# Patient Record
Sex: Male | Born: 1945 | Race: Black or African American | Hispanic: No | Marital: Married | Smoking: Never smoker
Health system: Southern US, Community
[De-identification: ages and names within clinical notes are randomized; demographics above are authoritative.]

## PROBLEM LIST (undated history)

## (undated) DIAGNOSIS — M1990 Unspecified osteoarthritis, unspecified site: Secondary | ICD-10-CM

## (undated) DIAGNOSIS — J45998 Other asthma: Secondary | ICD-10-CM

## (undated) DIAGNOSIS — K5790 Diverticulosis of intestine, part unspecified, without perforation or abscess without bleeding: Secondary | ICD-10-CM

## (undated) DIAGNOSIS — C61 Malignant neoplasm of prostate: Secondary | ICD-10-CM

## (undated) DIAGNOSIS — K869 Disease of pancreas, unspecified: Secondary | ICD-10-CM

## (undated) DIAGNOSIS — I10 Essential (primary) hypertension: Secondary | ICD-10-CM

## (undated) HISTORY — PX: BACK SURGERY: SHX140

## (undated) HISTORY — PX: TONSILLECTOMY: SUR1361

## (undated) HISTORY — PX: BLADDER SURGERY: SHX569

---

## 1986-07-16 HISTORY — PX: LUMBAR DISC SURGERY: SHX700

## 2002-11-08 ENCOUNTER — Emergency Department (HOSPITAL_COMMUNITY): Admission: EM | Admit: 2002-11-08 | Discharge: 2002-11-08 | Payer: Self-pay | Admitting: Emergency Medicine

## 2008-12-20 ENCOUNTER — Ambulatory Visit (HOSPITAL_COMMUNITY): Admission: RE | Admit: 2008-12-20 | Discharge: 2008-12-20 | Payer: Self-pay | Admitting: Internal Medicine

## 2009-07-16 HISTORY — PX: PROSTATECTOMY: SHX69

## 2011-07-30 DIAGNOSIS — I10 Essential (primary) hypertension: Secondary | ICD-10-CM | POA: Diagnosis not present

## 2011-08-21 DIAGNOSIS — R791 Abnormal coagulation profile: Secondary | ICD-10-CM | POA: Diagnosis not present

## 2011-08-21 DIAGNOSIS — C679 Malignant neoplasm of bladder, unspecified: Secondary | ICD-10-CM | POA: Diagnosis not present

## 2011-08-21 DIAGNOSIS — N390 Urinary tract infection, site not specified: Secondary | ICD-10-CM | POA: Diagnosis not present

## 2011-08-21 DIAGNOSIS — Z8546 Personal history of malignant neoplasm of prostate: Secondary | ICD-10-CM | POA: Diagnosis not present

## 2011-08-21 DIAGNOSIS — C61 Malignant neoplasm of prostate: Secondary | ICD-10-CM | POA: Diagnosis not present

## 2011-09-14 DIAGNOSIS — I10 Essential (primary) hypertension: Secondary | ICD-10-CM | POA: Diagnosis not present

## 2011-09-14 DIAGNOSIS — C679 Malignant neoplasm of bladder, unspecified: Secondary | ICD-10-CM | POA: Diagnosis not present

## 2011-09-14 DIAGNOSIS — C672 Malignant neoplasm of lateral wall of bladder: Secondary | ICD-10-CM | POA: Diagnosis not present

## 2011-09-14 DIAGNOSIS — C676 Malignant neoplasm of ureteric orifice: Secondary | ICD-10-CM | POA: Diagnosis not present

## 2011-09-14 DIAGNOSIS — R011 Cardiac murmur, unspecified: Secondary | ICD-10-CM | POA: Diagnosis not present

## 2011-09-14 DIAGNOSIS — C61 Malignant neoplasm of prostate: Secondary | ICD-10-CM | POA: Diagnosis not present

## 2011-09-17 DIAGNOSIS — I10 Essential (primary) hypertension: Secondary | ICD-10-CM | POA: Diagnosis not present

## 2011-10-02 DIAGNOSIS — K869 Disease of pancreas, unspecified: Secondary | ICD-10-CM | POA: Diagnosis not present

## 2011-10-02 DIAGNOSIS — K862 Cyst of pancreas: Secondary | ICD-10-CM | POA: Diagnosis not present

## 2011-10-02 DIAGNOSIS — C679 Malignant neoplasm of bladder, unspecified: Secondary | ICD-10-CM | POA: Diagnosis not present

## 2011-10-02 DIAGNOSIS — C61 Malignant neoplasm of prostate: Secondary | ICD-10-CM | POA: Diagnosis not present

## 2011-10-02 DIAGNOSIS — D136 Benign neoplasm of pancreas: Secondary | ICD-10-CM | POA: Diagnosis not present

## 2011-10-16 DIAGNOSIS — I10 Essential (primary) hypertension: Secondary | ICD-10-CM | POA: Diagnosis not present

## 2011-11-22 DIAGNOSIS — I10 Essential (primary) hypertension: Secondary | ICD-10-CM | POA: Diagnosis not present

## 2011-11-22 DIAGNOSIS — J40 Bronchitis, not specified as acute or chronic: Secondary | ICD-10-CM | POA: Diagnosis not present

## 2011-12-25 DIAGNOSIS — C61 Malignant neoplasm of prostate: Secondary | ICD-10-CM | POA: Diagnosis not present

## 2012-01-21 DIAGNOSIS — I10 Essential (primary) hypertension: Secondary | ICD-10-CM | POA: Diagnosis not present

## 2012-04-01 DIAGNOSIS — K869 Disease of pancreas, unspecified: Secondary | ICD-10-CM | POA: Diagnosis not present

## 2012-04-01 DIAGNOSIS — C679 Malignant neoplasm of bladder, unspecified: Secondary | ICD-10-CM | POA: Diagnosis not present

## 2012-04-01 DIAGNOSIS — C61 Malignant neoplasm of prostate: Secondary | ICD-10-CM | POA: Diagnosis not present

## 2012-04-01 DIAGNOSIS — K862 Cyst of pancreas: Secondary | ICD-10-CM | POA: Diagnosis not present

## 2012-04-08 DIAGNOSIS — N329 Bladder disorder, unspecified: Secondary | ICD-10-CM | POA: Diagnosis not present

## 2012-04-08 DIAGNOSIS — C61 Malignant neoplasm of prostate: Secondary | ICD-10-CM | POA: Diagnosis not present

## 2012-04-22 DIAGNOSIS — I10 Essential (primary) hypertension: Secondary | ICD-10-CM | POA: Diagnosis not present

## 2012-09-29 DIAGNOSIS — I10 Essential (primary) hypertension: Secondary | ICD-10-CM | POA: Diagnosis not present

## 2012-09-29 DIAGNOSIS — L678 Other hair color and hair shaft abnormalities: Secondary | ICD-10-CM | POA: Diagnosis not present

## 2012-10-03 DIAGNOSIS — C61 Malignant neoplasm of prostate: Secondary | ICD-10-CM | POA: Diagnosis not present

## 2012-10-07 DIAGNOSIS — N329 Bladder disorder, unspecified: Secondary | ICD-10-CM | POA: Diagnosis not present

## 2012-10-16 DIAGNOSIS — I10 Essential (primary) hypertension: Secondary | ICD-10-CM | POA: Diagnosis not present

## 2012-11-18 DIAGNOSIS — I10 Essential (primary) hypertension: Secondary | ICD-10-CM | POA: Diagnosis not present

## 2012-11-18 DIAGNOSIS — L2089 Other atopic dermatitis: Secondary | ICD-10-CM | POA: Diagnosis not present

## 2012-12-01 DIAGNOSIS — I10 Essential (primary) hypertension: Secondary | ICD-10-CM | POA: Diagnosis not present

## 2012-12-01 DIAGNOSIS — D075 Carcinoma in situ of prostate: Secondary | ICD-10-CM | POA: Diagnosis not present

## 2012-12-01 DIAGNOSIS — K922 Gastrointestinal hemorrhage, unspecified: Secondary | ICD-10-CM | POA: Diagnosis not present

## 2012-12-02 DIAGNOSIS — R9389 Abnormal findings on diagnostic imaging of other specified body structures: Secondary | ICD-10-CM | POA: Diagnosis not present

## 2012-12-02 DIAGNOSIS — R978 Other abnormal tumor markers: Secondary | ICD-10-CM | POA: Diagnosis not present

## 2012-12-02 DIAGNOSIS — K625 Hemorrhage of anus and rectum: Secondary | ICD-10-CM | POA: Diagnosis not present

## 2012-12-02 DIAGNOSIS — K862 Cyst of pancreas: Secondary | ICD-10-CM | POA: Diagnosis not present

## 2012-12-05 DIAGNOSIS — K5730 Diverticulosis of large intestine without perforation or abscess without bleeding: Secondary | ICD-10-CM | POA: Diagnosis not present

## 2012-12-05 DIAGNOSIS — K625 Hemorrhage of anus and rectum: Secondary | ICD-10-CM | POA: Diagnosis not present

## 2012-12-05 DIAGNOSIS — D126 Benign neoplasm of colon, unspecified: Secondary | ICD-10-CM | POA: Diagnosis not present

## 2012-12-05 DIAGNOSIS — K921 Melena: Secondary | ICD-10-CM | POA: Diagnosis not present

## 2012-12-05 DIAGNOSIS — I10 Essential (primary) hypertension: Secondary | ICD-10-CM | POA: Diagnosis not present

## 2013-01-13 DIAGNOSIS — K862 Cyst of pancreas: Secondary | ICD-10-CM | POA: Diagnosis not present

## 2013-01-13 DIAGNOSIS — K5730 Diverticulosis of large intestine without perforation or abscess without bleeding: Secondary | ICD-10-CM | POA: Diagnosis not present

## 2013-01-20 DIAGNOSIS — K863 Pseudocyst of pancreas: Secondary | ICD-10-CM | POA: Diagnosis not present

## 2013-01-20 DIAGNOSIS — K862 Cyst of pancreas: Secondary | ICD-10-CM | POA: Diagnosis not present

## 2013-02-02 DIAGNOSIS — R970 Elevated carcinoembryonic antigen [CEA]: Secondary | ICD-10-CM | POA: Diagnosis not present

## 2013-02-02 DIAGNOSIS — K862 Cyst of pancreas: Secondary | ICD-10-CM | POA: Diagnosis not present

## 2013-02-02 DIAGNOSIS — K863 Pseudocyst of pancreas: Secondary | ICD-10-CM | POA: Diagnosis not present

## 2013-03-24 DIAGNOSIS — I10 Essential (primary) hypertension: Secondary | ICD-10-CM | POA: Diagnosis not present

## 2013-04-14 DIAGNOSIS — C61 Malignant neoplasm of prostate: Secondary | ICD-10-CM | POA: Diagnosis not present

## 2013-06-08 DIAGNOSIS — R51 Headache: Secondary | ICD-10-CM | POA: Diagnosis not present

## 2013-06-08 DIAGNOSIS — I10 Essential (primary) hypertension: Secondary | ICD-10-CM | POA: Diagnosis not present

## 2013-07-13 DIAGNOSIS — I10 Essential (primary) hypertension: Secondary | ICD-10-CM | POA: Diagnosis not present

## 2013-08-06 DIAGNOSIS — I10 Essential (primary) hypertension: Secondary | ICD-10-CM | POA: Diagnosis not present

## 2013-08-17 DIAGNOSIS — I10 Essential (primary) hypertension: Secondary | ICD-10-CM | POA: Diagnosis not present

## 2013-11-23 DIAGNOSIS — I10 Essential (primary) hypertension: Secondary | ICD-10-CM | POA: Diagnosis not present

## 2014-04-06 DIAGNOSIS — C61 Malignant neoplasm of prostate: Secondary | ICD-10-CM | POA: Diagnosis not present

## 2014-04-06 DIAGNOSIS — K862 Cyst of pancreas: Secondary | ICD-10-CM | POA: Diagnosis not present

## 2014-04-06 DIAGNOSIS — Z125 Encounter for screening for malignant neoplasm of prostate: Secondary | ICD-10-CM | POA: Diagnosis not present

## 2014-04-06 DIAGNOSIS — K863 Pseudocyst of pancreas: Secondary | ICD-10-CM | POA: Diagnosis not present

## 2014-04-13 DIAGNOSIS — Z8546 Personal history of malignant neoplasm of prostate: Secondary | ICD-10-CM | POA: Diagnosis not present

## 2014-04-13 DIAGNOSIS — Z8551 Personal history of malignant neoplasm of bladder: Secondary | ICD-10-CM | POA: Diagnosis not present

## 2014-05-31 DIAGNOSIS — E559 Vitamin D deficiency, unspecified: Secondary | ICD-10-CM | POA: Diagnosis not present

## 2014-05-31 DIAGNOSIS — D649 Anemia, unspecified: Secondary | ICD-10-CM | POA: Diagnosis not present

## 2014-05-31 DIAGNOSIS — I10 Essential (primary) hypertension: Secondary | ICD-10-CM | POA: Diagnosis not present

## 2014-05-31 DIAGNOSIS — E538 Deficiency of other specified B group vitamins: Secondary | ICD-10-CM | POA: Diagnosis not present

## 2014-07-20 DIAGNOSIS — N329 Bladder disorder, unspecified: Secondary | ICD-10-CM | POA: Diagnosis not present

## 2014-07-20 DIAGNOSIS — Z8546 Personal history of malignant neoplasm of prostate: Secondary | ICD-10-CM | POA: Diagnosis not present

## 2014-07-26 DIAGNOSIS — I10 Essential (primary) hypertension: Secondary | ICD-10-CM | POA: Diagnosis not present

## 2014-07-26 DIAGNOSIS — D649 Anemia, unspecified: Secondary | ICD-10-CM | POA: Diagnosis not present

## 2014-07-30 DIAGNOSIS — N3289 Other specified disorders of bladder: Secondary | ICD-10-CM | POA: Diagnosis not present

## 2014-07-30 DIAGNOSIS — C672 Malignant neoplasm of lateral wall of bladder: Secondary | ICD-10-CM | POA: Diagnosis not present

## 2014-07-30 DIAGNOSIS — Z801 Family history of malignant neoplasm of trachea, bronchus and lung: Secondary | ICD-10-CM | POA: Diagnosis not present

## 2014-07-30 DIAGNOSIS — D303 Benign neoplasm of bladder: Secondary | ICD-10-CM | POA: Diagnosis not present

## 2014-07-30 DIAGNOSIS — N329 Bladder disorder, unspecified: Secondary | ICD-10-CM | POA: Diagnosis not present

## 2014-07-30 DIAGNOSIS — Z8042 Family history of malignant neoplasm of prostate: Secondary | ICD-10-CM | POA: Diagnosis not present

## 2014-07-30 DIAGNOSIS — Z8551 Personal history of malignant neoplasm of bladder: Secondary | ICD-10-CM | POA: Diagnosis not present

## 2014-07-30 DIAGNOSIS — I10 Essential (primary) hypertension: Secondary | ICD-10-CM | POA: Diagnosis not present

## 2014-07-30 DIAGNOSIS — Z8546 Personal history of malignant neoplasm of prostate: Secondary | ICD-10-CM | POA: Diagnosis not present

## 2014-07-30 DIAGNOSIS — D414 Neoplasm of uncertain behavior of bladder: Secondary | ICD-10-CM | POA: Diagnosis not present

## 2014-10-26 DIAGNOSIS — Z8546 Personal history of malignant neoplasm of prostate: Secondary | ICD-10-CM | POA: Diagnosis not present

## 2014-12-02 DIAGNOSIS — I10 Essential (primary) hypertension: Secondary | ICD-10-CM | POA: Diagnosis not present

## 2014-12-02 DIAGNOSIS — D649 Anemia, unspecified: Secondary | ICD-10-CM | POA: Diagnosis not present

## 2015-01-13 DIAGNOSIS — I10 Essential (primary) hypertension: Secondary | ICD-10-CM | POA: Diagnosis not present

## 2015-01-13 DIAGNOSIS — Z01818 Encounter for other preprocedural examination: Secondary | ICD-10-CM | POA: Diagnosis not present

## 2015-01-13 DIAGNOSIS — R011 Cardiac murmur, unspecified: Secondary | ICD-10-CM | POA: Diagnosis not present

## 2015-01-13 DIAGNOSIS — K862 Cyst of pancreas: Secondary | ICD-10-CM | POA: Diagnosis not present

## 2015-01-13 DIAGNOSIS — I4510 Unspecified right bundle-branch block: Secondary | ICD-10-CM | POA: Diagnosis not present

## 2015-01-13 DIAGNOSIS — Z8551 Personal history of malignant neoplasm of bladder: Secondary | ICD-10-CM | POA: Diagnosis not present

## 2015-01-13 DIAGNOSIS — Z8546 Personal history of malignant neoplasm of prostate: Secondary | ICD-10-CM | POA: Diagnosis not present

## 2015-01-21 DIAGNOSIS — Z8551 Personal history of malignant neoplasm of bladder: Secondary | ICD-10-CM | POA: Diagnosis not present

## 2015-01-21 DIAGNOSIS — C679 Malignant neoplasm of bladder, unspecified: Secondary | ICD-10-CM | POA: Diagnosis not present

## 2015-01-21 DIAGNOSIS — D303 Benign neoplasm of bladder: Secondary | ICD-10-CM | POA: Diagnosis not present

## 2015-01-21 DIAGNOSIS — R011 Cardiac murmur, unspecified: Secondary | ICD-10-CM | POA: Diagnosis not present

## 2015-01-21 DIAGNOSIS — N329 Bladder disorder, unspecified: Secondary | ICD-10-CM | POA: Diagnosis not present

## 2015-01-21 DIAGNOSIS — Z9079 Acquired absence of other genital organ(s): Secondary | ICD-10-CM | POA: Diagnosis not present

## 2015-01-21 DIAGNOSIS — Z8546 Personal history of malignant neoplasm of prostate: Secondary | ICD-10-CM | POA: Diagnosis not present

## 2015-01-21 DIAGNOSIS — I10 Essential (primary) hypertension: Secondary | ICD-10-CM | POA: Diagnosis not present

## 2015-04-12 DIAGNOSIS — K862 Cyst of pancreas: Secondary | ICD-10-CM | POA: Diagnosis not present

## 2015-04-26 DIAGNOSIS — Z8546 Personal history of malignant neoplasm of prostate: Secondary | ICD-10-CM | POA: Diagnosis not present

## 2015-04-27 DIAGNOSIS — D649 Anemia, unspecified: Secondary | ICD-10-CM | POA: Diagnosis not present

## 2015-04-27 DIAGNOSIS — M2550 Pain in unspecified joint: Secondary | ICD-10-CM | POA: Diagnosis not present

## 2015-04-27 DIAGNOSIS — I10 Essential (primary) hypertension: Secondary | ICD-10-CM | POA: Diagnosis not present

## 2015-04-27 DIAGNOSIS — E559 Vitamin D deficiency, unspecified: Secondary | ICD-10-CM | POA: Diagnosis not present

## 2015-08-31 DIAGNOSIS — J40 Bronchitis, not specified as acute or chronic: Secondary | ICD-10-CM | POA: Diagnosis not present

## 2015-08-31 DIAGNOSIS — D649 Anemia, unspecified: Secondary | ICD-10-CM | POA: Diagnosis not present

## 2015-08-31 DIAGNOSIS — Z125 Encounter for screening for malignant neoplasm of prostate: Secondary | ICD-10-CM | POA: Diagnosis not present

## 2015-08-31 DIAGNOSIS — K529 Noninfective gastroenteritis and colitis, unspecified: Secondary | ICD-10-CM | POA: Diagnosis not present

## 2015-08-31 DIAGNOSIS — I10 Essential (primary) hypertension: Secondary | ICD-10-CM | POA: Diagnosis not present

## 2015-11-24 ENCOUNTER — Observation Stay (HOSPITAL_COMMUNITY): Payer: Medicare Other

## 2015-11-24 ENCOUNTER — Emergency Department (HOSPITAL_COMMUNITY): Payer: Medicare Other

## 2015-11-24 ENCOUNTER — Encounter (HOSPITAL_COMMUNITY): Payer: Self-pay | Admitting: Emergency Medicine

## 2015-11-24 ENCOUNTER — Inpatient Hospital Stay (HOSPITAL_COMMUNITY)
Admission: EM | Admit: 2015-11-24 | Discharge: 2015-11-26 | DRG: 378 | Disposition: A | Discharge status: Home / Self Care | Payer: Medicare Other | Attending: Family Medicine | Admitting: Family Medicine

## 2015-11-24 DIAGNOSIS — D649 Anemia, unspecified: Secondary | ICD-10-CM | POA: Insufficient documentation

## 2015-11-24 DIAGNOSIS — I10 Essential (primary) hypertension: Secondary | ICD-10-CM | POA: Diagnosis not present

## 2015-11-24 DIAGNOSIS — K922 Gastrointestinal hemorrhage, unspecified: Secondary | ICD-10-CM | POA: Diagnosis present

## 2015-11-24 DIAGNOSIS — K869 Disease of pancreas, unspecified: Secondary | ICD-10-CM | POA: Diagnosis not present

## 2015-11-24 DIAGNOSIS — K5791 Diverticulosis of intestine, part unspecified, without perforation or abscess with bleeding: Principal | ICD-10-CM | POA: Diagnosis present

## 2015-11-24 DIAGNOSIS — K219 Gastro-esophageal reflux disease without esophagitis: Secondary | ICD-10-CM | POA: Diagnosis not present

## 2015-11-24 DIAGNOSIS — R42 Dizziness and giddiness: Secondary | ICD-10-CM | POA: Diagnosis not present

## 2015-11-24 DIAGNOSIS — N179 Acute kidney failure, unspecified: Secondary | ICD-10-CM | POA: Diagnosis present

## 2015-11-24 DIAGNOSIS — K625 Hemorrhage of anus and rectum: Secondary | ICD-10-CM | POA: Diagnosis not present

## 2015-11-24 DIAGNOSIS — Z8546 Personal history of malignant neoplasm of prostate: Secondary | ICD-10-CM

## 2015-11-24 DIAGNOSIS — E46 Unspecified protein-calorie malnutrition: Secondary | ICD-10-CM | POA: Diagnosis not present

## 2015-11-24 DIAGNOSIS — I959 Hypotension, unspecified: Secondary | ICD-10-CM | POA: Diagnosis present

## 2015-11-24 DIAGNOSIS — R935 Abnormal findings on diagnostic imaging of other abdominal regions, including retroperitoneum: Secondary | ICD-10-CM | POA: Diagnosis not present

## 2015-11-24 DIAGNOSIS — Z6832 Body mass index (BMI) 32.0-32.9, adult: Secondary | ICD-10-CM

## 2015-11-24 DIAGNOSIS — D62 Acute posthemorrhagic anemia: Secondary | ICD-10-CM | POA: Diagnosis not present

## 2015-11-24 DIAGNOSIS — K921 Melena: Secondary | ICD-10-CM | POA: Diagnosis not present

## 2015-11-24 HISTORY — DX: Unspecified osteoarthritis, unspecified site: M19.90

## 2015-11-24 HISTORY — DX: Essential (primary) hypertension: I10

## 2015-11-24 HISTORY — DX: Malignant neoplasm of prostate: C61

## 2015-11-24 HISTORY — DX: Disease of pancreas, unspecified: K86.9

## 2015-11-24 HISTORY — DX: Other asthma: J45.998

## 2015-11-24 LAB — I-STAT CHEM 8, ED
BUN: 19 mg/dL (ref 6–20)
Calcium, Ion: 1.16 mmol/L (ref 1.13–1.30)
Chloride: 104 mmol/L (ref 101–111)
Creatinine, Ser: 1.3 mg/dL — ABNORMAL HIGH (ref 0.61–1.24)
Glucose, Bld: 113 mg/dL — ABNORMAL HIGH (ref 65–99)
HCT: 35 % — ABNORMAL LOW (ref 39.0–52.0)
Hemoglobin: 11.9 g/dL — ABNORMAL LOW (ref 13.0–17.0)
Potassium: 4.1 mmol/L (ref 3.5–5.1)
Sodium: 138 mmol/L (ref 135–145)
TCO2: 22 mmol/L (ref 0–100)

## 2015-11-24 LAB — CBC WITH DIFFERENTIAL/PLATELET
Basophils Absolute: 0 10*3/uL (ref 0.0–0.1)
Basophils Relative: 0 %
Eosinophils Absolute: 0 10*3/uL (ref 0.0–0.7)
Eosinophils Relative: 0 %
HCT: 30.6 % — ABNORMAL LOW (ref 39.0–52.0)
Hemoglobin: 9.7 g/dL — ABNORMAL LOW (ref 13.0–17.0)
Lymphocytes Relative: 13 %
Lymphs Abs: 0.9 10*3/uL (ref 0.7–4.0)
MCH: 19.2 pg — ABNORMAL LOW (ref 26.0–34.0)
MCHC: 31.7 g/dL (ref 30.0–36.0)
MCV: 60.7 fL — ABNORMAL LOW (ref 78.0–100.0)
Monocytes Absolute: 0.4 10*3/uL (ref 0.1–1.0)
Monocytes Relative: 6 %
Neutro Abs: 5.9 10*3/uL (ref 1.7–7.7)
Neutrophils Relative %: 81 %
Platelets: 206 10*3/uL (ref 150–400)
RBC: 5.04 MIL/uL (ref 4.22–5.81)
RDW: 16.8 % — ABNORMAL HIGH (ref 11.5–15.5)
WBC: 7.2 10*3/uL (ref 4.0–10.5)

## 2015-11-24 LAB — COMPREHENSIVE METABOLIC PANEL
ALT: 25 U/L (ref 17–63)
AST: 31 U/L (ref 15–41)
Albumin: 3.3 g/dL — ABNORMAL LOW (ref 3.5–5.0)
Alkaline Phosphatase: 37 U/L — ABNORMAL LOW (ref 38–126)
Anion gap: 13 (ref 5–15)
BUN: 15 mg/dL (ref 6–20)
CO2: 20 mmol/L — ABNORMAL LOW (ref 22–32)
Calcium: 9.5 mg/dL (ref 8.9–10.3)
Chloride: 105 mmol/L (ref 101–111)
Creatinine, Ser: 1.45 mg/dL — ABNORMAL HIGH (ref 0.61–1.24)
GFR calc Af Amer: 55 mL/min — ABNORMAL LOW (ref >60)
GFR calc non Af Amer: 47 mL/min — ABNORMAL LOW (ref >60)
Glucose, Bld: 115 mg/dL — ABNORMAL HIGH (ref 65–99)
Potassium: 4 mmol/L (ref 3.5–5.1)
Sodium: 138 mmol/L (ref 135–145)
Total Bilirubin: 0.8 mg/dL (ref 0.3–1.2)
Total Protein: 5.8 g/dL — ABNORMAL LOW (ref 6.5–8.1)

## 2015-11-24 LAB — I-STAT TROPONIN, ED: Troponin i, poc: 0.01 ng/mL (ref 0.00–0.08)

## 2015-11-24 LAB — POC OCCULT BLOOD, ED: Fecal Occult Bld: POSITIVE — AB

## 2015-11-24 LAB — AMMONIA: Ammonia: 34 umol/L (ref 9–35)

## 2015-11-24 LAB — APTT: aPTT: 25 seconds (ref 24–37)

## 2015-11-24 LAB — PROTIME-INR
INR: 1.16 (ref 0.00–1.49)
INR: 1.17 (ref 0.00–1.49)
Prothrombin Time: 15 seconds (ref 11.6–15.2)
Prothrombin Time: 15.1 seconds (ref 11.6–15.2)

## 2015-11-24 LAB — I-STAT CG4 LACTIC ACID, ED
Lactic Acid, Venous: 4.02 mmol/L (ref 0.5–2.0)
Lactic Acid, Venous: 1.24 mmol/L (ref 0.5–2.0)

## 2015-11-24 LAB — HEMOGLOBIN AND HEMATOCRIT, BLOOD
HCT: 27.1 % — ABNORMAL LOW (ref 39.0–52.0)
Hemoglobin: 8.7 g/dL — ABNORMAL LOW (ref 13.0–17.0)

## 2015-11-24 LAB — LIPASE, BLOOD: Lipase: 21 U/L (ref 11–51)

## 2015-11-24 LAB — ABO/RH: ABO/RH(D): O POS

## 2015-11-24 MED ORDER — SODIUM CHLORIDE 0.9 % IV SOLN
INTRAVENOUS | Status: DC
  Administered 2015-11-24: 17:00:00 via INTRAVENOUS

## 2015-11-24 MED ORDER — SODIUM CHLORIDE 0.9 % IV BOLUS (SEPSIS)
1000.0000 mL | Freq: Once | INTRAVENOUS | Status: AC
  Administered 2015-11-24: 1000 mL via INTRAVENOUS

## 2015-11-24 MED ORDER — ONDANSETRON HCL 4 MG/2ML IJ SOLN: 4.0000 mg | Freq: Four times a day (QID) | INTRAMUSCULAR | Status: DC | PRN

## 2015-11-24 MED ORDER — SODIUM CHLORIDE 0.9 % IV SOLN
8.0000 mg/h | INTRAVENOUS | Status: DC
  Administered 2015-11-24: 8 mg/h via INTRAVENOUS
  Filled 2015-11-24 (×2): qty 80

## 2015-11-24 MED ORDER — SODIUM CHLORIDE 0.9 % IV SOLN
80.0000 mg | Freq: Once | INTRAVENOUS | Status: AC
  Administered 2015-11-24: 80 mg via INTRAVENOUS
  Filled 2015-11-24: qty 80

## 2015-11-24 MED ORDER — IOPAMIDOL (ISOVUE-300) INJECTION 61%
INTRAVENOUS | Status: AC
  Filled 2015-11-24: qty 100

## 2015-11-24 MED ORDER — IOPAMIDOL (ISOVUE-300) INJECTION 61%
INTRAVENOUS | Status: AC
  Administered 2015-11-24: 100 mL
  Filled 2015-11-24: qty 100

## 2015-11-24 MED ORDER — ONDANSETRON HCL 4 MG PO TABS: 4.0000 mg | ORAL_TABLET | Freq: Four times a day (QID) | ORAL | Status: DC | PRN

## 2015-11-24 MED ORDER — PANTOPRAZOLE SODIUM 40 MG IV SOLR
40.0000 mg | Freq: Two times a day (BID) | INTRAVENOUS | Status: DC
  Administered 2015-11-24 – 2015-11-26 (×4): 40 mg via INTRAVENOUS
  Filled 2015-11-24 (×4): qty 40

## 2015-11-24 MED ORDER — POLYETHYLENE GLYCOL 3350 17 G PO PACK
17.0000 g | PACK | Freq: Three times a day (TID) | ORAL | Status: DC
  Administered 2015-11-24 – 2015-11-25 (×4): 17 g via ORAL
  Filled 2015-11-24 (×5): qty 1

## 2015-11-24 MED ORDER — SODIUM CHLORIDE 0.9 % IV SOLN
INTRAVENOUS | Status: DC
  Administered 2015-11-25 – 2015-11-26 (×3): via INTRAVENOUS

## 2015-11-24 MED ORDER — SODIUM CHLORIDE 0.9 % IV SOLN
INTRAVENOUS | Status: AC
  Administered 2015-11-24: 22:00:00 via INTRAVENOUS

## 2015-11-24 NOTE — Progress Notes (Signed)
Pt wife came to door asking for assistance with beeping pump.  EDCM inspected pump to find that "air in line" was message;  [facility] turned channel off (Protonix) and notified RN Elmyra Ricks).

## 2015-11-24 NOTE — ED Notes (Signed)
Pt in from home, phlebotomy at bedside, Sys BP noted to be in the 70s with pt flailing arms, diaphoretic with intermittent confusion, Charge Rn informed & working on an available room, pt pale, skin clammy & cool to touch, IV started in triage

## 2015-11-24 NOTE — ED Notes (Signed)
Report called to Vickie rn and pt transported to CT.

## 2015-11-24 NOTE — H&P (Signed)
[facility] Admission History and Physical Service Pager: [phone]  Patient name: Gregory Odonnell Medical record number: [id number] Date of birth: 03-Jun-1946 Age: 70 y.o. Gender: male  Primary Care Provider: No primary care provider on file. Consultants: GI [facility]) Code Status: Full (per discussion on admission)  Chief Complaint: rectal bleeding  Assessment and Plan: Gregory Odonnell is a 70 y.o. male presenting with hematochezia. PMH is significant for HTN, prostate cancer, pancreatic lesion, and GI bleed.   GI bleed (bright red blood per rectum): Symptomatic hypotension on admission with BP of  75/44 > 122/82, BP stable now but does have slight tachycardia to 100-105. Pt had 15 loose stools with hematochezia since 0400 so likely volume depleted. Afebrile without leukocytosis (low suspicion for infection). Likely differentials for lower GI bleeding include hemorrhoids vs diverticular hemorhage vs AVM vs neoplastic disease. Last colonoscopy was apparently 2 years ago which was reportedly benign. No hx of colon cancer, coagulapathy, or excessive NSAID use. Hgb found to be 9.7 @ 1126 and 11.9 @1218  (unsure which is more accurate). PTT and INR normal at 15 and 1.16 respectively. + FOBT.  - Admit to [place], attending Dr. Andria Frames - Vital signs per floor protocol - GI consulted, appreciate their recommendations and assistance  -clear liquid diet, Miralax  -serial hemoglobins - mIVF: Normal saline @ 150 cc/hr - Continuous cardiac monitoring - hemoglobin/hematocrit q12 - No anticoagulation or DVT prophylaxis with active bleed - Likely lower GI bleed but will place on PPI BID until fully evaluated.  Hx of Hypertension: Symptomatic hypotension on admission. BP normal now at 131/72 after IVF. Home BP meds include Amlodipine 10 mg daily and Valsartan-HCTZ 320-12.5mg  daily.  - Hold home antihypertensives for now - If patient becomes hypertensive, may add home medications  back  AKI: Cr on admission 1.45. No other values in Epic for comparison. Likely due to volume depletion for hematochezia and diarrhea. - AM BMP - mIVF as above  FEN/GI: Normal saline @ 150 cc/hr (maintenance)/ Clears Prophylaxis: SCDs  Disposition: Admit to telemetry, attending Dr. Andria Frames  History of Present Illness:  Gregory Odonnell is a 70 y.o. male presenting with bright red blood per rectum  Patient notes that around 4am had BRBPR after a loose stool.  He notes that he had 15 loose stools before arriving to ED.  He notes that he became faint once he arrived to ED.  Has had 1 episode of hematochezia about 2 years ago.  He had a colonoscopy at [facility] during which he had a biopsy that was benign.  Denies fevers, chills, nausea, vomiting, abdominal pain, constipation, cough, congestion, myalgia, easy bruising/bleeding, SOB, palpitations.  No family h/o coagulopathies.  Occ left shoulder pain.  Patient notes that he has been on a healthy eating plan but no new foods.  No one else in family with diarrhea.  No new medications.   Patient takes a ASA 81mg .  Has not eaten today.  Review Of Systems: Per HPI with the following additions: none Otherwise the remainder of the systems were negative.  Patient Active Problem List   Diagnosis Date Noted  . GI bleed 11/24/2015  . HTN (hypertension) 11/24/2015  . History of prostate cancer 11/24/2015  . Pancreatic lesion 11/24/2015    Past Medical History: Past Medical History  Diagnosis Date  . Hypertension   . 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 Pancreatic lesion     routinely followed by [facility]  Past Surgical History: Past Surgical History  Procedure Laterality Date  . Back surgery  1988  . Prostate surgery  2011    Social History: Social History  Substance Use Topics  . Smoking status: Never Smoker   . Smokeless tobacco: None  . Alcohol Use: No   Additional social history: occ ETOH (q5 months), no  tobacco, no drugs, resides with wife  Please also refer to relevant sections of EMR.  Family History: Family History  Problem Relation Age of Onset  . Cancer Father     prostate  . Cancer Brother     lung   Allergies and Medications: No Known Allergies No current facility-administered medications on file prior to encounter.   No current outpatient prescriptions on file prior to encounter.   Objective: BP 134/79 mmHg  Pulse 94  Temp(Src) 98.1 F (36.7 C)  Resp 10  Ht 6\' 1"  (1.854 m)  Wt 245 lb (111.131 kg)  BMI 32.33 kg/m2  SpO2 97% Exam: General: In NAD, laying in bed, well-developed, pleasant, wife at bedside Eyes: PERRLA, non icteric sclera ENTM: slightly dry mucous membranes  Neck: supple, no lymphadenopathy Cardiovascular: tachycardic, regular rhythm, no murmurs, no edema. 2+ dorsalis pedis pulses bilaterally Respiratory: no increased work of breathing, CTAB Abdomen: soft, non-tender, normal bowel sounds MSK: full ROM of all joints Skin: no rashes Neuro: CN 2-12 intact, 5/5 strength in bilateral upper and lower extremities, sensation intact throughout Psych: normal mood and affect  Labs and Imaging: CBC BMET   Recent Labs Lab 11/24/15 1156 11/24/15 1218  WBC 7.2  --   HGB 9.7* 11.9*  HCT 30.6* 35.0*  PLT 206  --     Recent Labs Lab 11/24/15 1156 11/24/15 1218  NA 138 138  K 4.0 4.1  CL 105 104  CO2 20*  --   BUN 15 19  CREATININE 1.45* 1.30*  GLUCOSE 115* 113*  CALCIUM 9.5  --      Dg Chest Portable 1 View  11/24/2015  CLINICAL DATA:  Dizziness. EXAM: PORTABLE CHEST 1 VIEW COMPARISON:  December 20, 2008. FINDINGS: Stable cardiomediastinal silhouette. No pneumothorax or pleural effusion is noted. Both lungs are clear. The visualized skeletal structures are unremarkable. IMPRESSION: No acute cardiopulmonary abnormality seen. Electronically Signed   By: Marijo Conception, M.D.   On: 11/24/2015 13:33   Dg Abd Portable 1v  11/24/2015  CLINICAL DATA:   Patient with history of GI bleed. Bloody diarrhea. History of prostate cancer. EXAM: PORTABLE ABDOMEN - 1 VIEW COMPARISON:  None. FINDINGS: Gas is demonstrated within nondilated loops of large and small bowel in a nonobstructed pattern. The superior abdomen is excluded from view. Lumbar spine degenerative changes. No aggressive or acute appearing osseous lesions. IMPRESSION: Nonobstructed bowel gas pattern. Electronically Signed   By: Lovey Newcomer M.D.   On: 11/24/2015 13:37    Carlyle Dolly, MD 11/24/2015, 3:24 PM PGY-1, [place] Intern pager: [phone], text pages welcome  I have separately seen and examined the patient. I have discussed the findings and exam with Dr Juanito Doom and agree with the above note.  My changes/additions are outlined in BLUE.   Ashly M. Lajuana Ripple, DO PGY-2, [place]

## 2015-11-24 NOTE — Consult Note (Signed)
[facility] CONSULT Reason for consult:LGI Bleed Referring Physician: [facility] Teaching Service  Gregory Odonnell is an 70 y.o. male.  HPI: patient has had several hours of bright red blood per rectum. This is painless. He has had colonoscopy in the past [facility] and is not sure what that showed feels that it did not show anything significant. He has a lesion is pancreas and is followed by someone at [facility]. Apparently this lesion is not changed in several years and he was told to come back in 2 years to have further studies to make sure did not enlarged. He has not had bleeding like this before. No family history of colon cancer. Initial hemoglobin 11.9. No anticoagulation.  Past Medical History  Diagnosis Date  . Hypertension   . 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 Pancreatic lesion     routinely followed by [facility]    Past Surgical History  Procedure Laterality Date  . Back surgery  1988  . Prostate surgery  2011    Family History  Problem Relation Age of Onset  . Cancer Father     prostate  . Cancer Brother     lung    Social History:  reports that he has never smoked. He does not have any smokeless tobacco history on file. He reports that he does not drink alcohol or use illicit drugs.  Allergies: No Known Allergies  Medications; Prior to Admission medications   Medication Sig Start Date End Date Taking? Authorizing Provider  amLODipine (NORVASC) 10 MG tablet Take 10 mg by mouth daily. 02/22/14  Yes Historical Provider, MD  amLODipine (NORVASC) 10 MG tablet Take 10 mg by mouth daily. 09/26/15  Yes Historical Provider, MD  aspirin EC 81 MG tablet Take 81 mg by mouth.   Yes Historical Provider, MD  Cholecalciferol (VITAMIN D3) 2000 units capsule Take 2,000 Units by mouth daily.   Yes Historical Provider, MD  Multiple Vitamins-Minerals (MULTIVITAMIN WITH MINERALS) tablet Take 1 tablet by mouth daily.   Yes Historical  Provider, MD  valsartan-hydrochlorothiazide (DIOVAN-HCT) 320-12.5 MG tablet Take 1 tablet by mouth daily. 03/23/14  Yes Historical Provider, MD   . iopamidol       PRN Meds  Results for orders placed or performed during the hospital encounter of 11/24/15 (from the past 48 hour(s))  Comprehensive metabolic panel     Status: Abnormal   Collection Time: 11/24/15 11:56 AM  Result Value Ref Range   Sodium 138 135 - 145 mmol/L   Potassium 4.0 3.5 - 5.1 mmol/L   Chloride 105 101 - 111 mmol/L   CO2 20 (L) 22 - 32 mmol/L   Glucose, Bld 115 (H) 65 - 99 mg/dL   BUN 15 6 - 20 mg/dL   Creatinine, Ser 1.45 (H) 0.61 - 1.24 mg/dL   Calcium 9.5 8.9 - 10.3 mg/dL   Total Protein 5.8 (L) 6.5 - 8.1 g/dL   Albumin 3.3 (L) 3.5 - 5.0 g/dL   AST 31 15 - 41 U/L   ALT 25 17 - 63 U/L   Alkaline Phosphatase 37 (L) 38 - 126 U/L   Total Bilirubin 0.8 0.3 - 1.2 mg/dL   GFR calc non Af Amer 47 (L) >60 mL/min   GFR calc Af Amer 55 (L) >60 mL/min    Comment: (NOTE) The eGFR has been calculated using the CKD EPI equation. This calculation has not been validated in all clinical situations. eGFR's  persistently <60 mL/min signify possible Chronic Kidney Disease.    Anion gap 13 5 - 15  CBC WITH DIFFERENTIAL     Status: Abnormal   Collection Time: 11/24/15 11:56 AM  Result Value Ref Range   WBC 7.2 4.0 - 10.5 K/uL    Comment: WHITE COUNT CONFIRMED ON SMEAR   RBC 5.04 4.22 - 5.81 MIL/uL   Hemoglobin 9.7 (L) 13.0 - 17.0 g/dL   HCT 30.6 (L) 39.0 - 52.0 %   MCV 60.7 (L) 78.0 - 100.0 fL   MCH 19.2 (L) 26.0 - 34.0 pg   MCHC 31.7 30.0 - 36.0 g/dL   RDW 16.8 (H) 11.5 - 15.5 %   Platelets 206 150 - 400 K/uL    Comment: PLATELET COUNT CONFIRMED BY SMEAR   Neutrophils Relative % 81 %   Lymphocytes Relative 13 %   Monocytes Relative 6 %   Eosinophils Relative 0 %   Basophils Relative 0 %   Neutro Abs 5.9 1.7 - 7.7 K/uL   Lymphs Abs 0.9 0.7 - 4.0 K/uL   Monocytes Absolute 0.4 0.1 - 1.0 K/uL   Eosinophils Absolute  0.0 0.0 - 0.7 K/uL   Basophils Absolute 0.0 0.0 - 0.1 K/uL   RBC Morphology POLYCHROMASIA PRESENT     Comment: ELLIPTOCYTES RARE NRBCs   Lipase, blood     Status: None   Collection Time: 11/24/15 11:56 AM  Result Value Ref Range   Lipase 21 11 - 51 U/L  Protime-INR     Status: None   Collection Time: 11/24/15 11:56 AM  Result Value Ref Range   Prothrombin Time 15.0 11.6 - 15.2 seconds   INR 1.16 0.00 - 1.49  Type and screen [place]     Status: None   Collection Time: 11/24/15 11:57 AM  Result Value Ref Range   ABO/RH(D) O POS    Antibody Screen NEG    Sample Expiration 11/27/2015   ABO/Rh     Status: None   Collection Time: 11/24/15 11:57 AM  Result Value Ref Range   ABO/RH(D) O POS   I-stat troponin, ED     Status: None   Collection Time: 11/24/15 12:17 PM  Result Value Ref Range   Troponin i, poc 0.01 0.00 - 0.08 ng/mL   Comment 3            Comment: Due to the release kinetics of cTnI, a negative result within the first hours of the onset of symptoms does not rule out myocardial infarction with certainty. If myocardial infarction is still suspected, repeat the test at appropriate intervals.   I-Stat Chem 8, ED  (not at [facility], [facility])     Status: Abnormal   Collection Time: 11/24/15 12:18 PM  Result Value Ref Range   Sodium 138 135 - 145 mmol/L   Potassium 4.1 3.5 - 5.1 mmol/L   Chloride 104 101 - 111 mmol/L   BUN 19 6 - 20 mg/dL   Creatinine, Ser 1.30 (H) 0.61 - 1.24 mg/dL   Glucose, Bld 113 (H) 65 - 99 mg/dL   Calcium, Ion 1.16 1.13 - 1.30 mmol/L   TCO2 22 0 - 100 mmol/L   Hemoglobin 11.9 (L) 13.0 - 17.0 g/dL   HCT 35.0 (L) 39.0 - 52.0 %  I-Stat CG4 Lactic Acid, ED  (not at [facility])     Status: Abnormal   Collection Time: 11/24/15 12:19 PM  Result Value Ref Range   Lactic Acid, Venous 4.02 (HH) 0.5 -  2.0 mmol/L   Comment NOTIFIED PHYSICIAN   POC occult blood, ED Provider will collect     Status: Abnormal   Collection Time: 11/24/15 12:31 PM   Result Value Ref Range   Fecal Occult Bld POSITIVE (A) NEGATIVE  Ammonia     Status: None   Collection Time: 11/24/15  1:36 PM  Result Value Ref Range   Ammonia 34 9 - 35 umol/L  I-Stat CG4 Lactic Acid, ED     Status: None   Collection Time: 11/24/15  2:35 PM  Result Value Ref Range   Lactic Acid, Venous 1.24 0.5 - 2.0 mmol/L    Dg Chest Portable 1 View  11/24/2015  CLINICAL DATA:  Dizziness. EXAM: PORTABLE CHEST 1 VIEW COMPARISON:  December 20, 2008. FINDINGS: Stable cardiomediastinal silhouette. No pneumothorax or pleural effusion is noted. Both lungs are clear. The visualized skeletal structures are unremarkable. IMPRESSION: No acute cardiopulmonary abnormality seen. Electronically Signed   By: Marijo Conception, M.D.   On: 11/24/2015 13:33   Dg Abd Portable 1v  11/24/2015  CLINICAL DATA:  Patient with history of GI bleed. Bloody diarrhea. History of prostate cancer. EXAM: PORTABLE ABDOMEN - 1 VIEW COMPARISON:  None. FINDINGS: Gas is demonstrated within nondilated loops of large and small bowel in a nonobstructed pattern. The superior abdomen is excluded from view. Lumbar spine degenerative changes. No aggressive or acute appearing osseous lesions. IMPRESSION: Nonobstructed bowel gas pattern. Electronically Signed   By: Lovey Newcomer M.D.   On: 11/24/2015 13:37               Blood pressure 148/88, pulse 101, temperature 98.1 F (36.7 C), resp. rate 16, height 6' 1"  (1.854 m), weight 111.131 kg (245 lb), SpO2 96 %.  Physical exam:   General--AA male  ENT--nonicteric mucous membranes moist  Neck--supplemental lymphadenopathy  Heart--regular rate and rhythm without murmurs are gallops  Lungs--clear  Abdomen--soft and nontender  Psych--alert and oriented   Assessment: 1. 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 He has had previous colonoscopy within the past several years.  Plan: 1. Would observe with serial hemoglobin's. We'll go ahead and start all Miralax  and clear liquid diet. We will follow with you.   Janyth Riera JR,Damaree Sargent L 11/24/2015, 4:26 PM   This note was created using voice recognition software and minor errors may Have occurred unintentionally. Pager: [phone] If no answer or after hours call [phone]

## 2015-11-24 NOTE — ED Notes (Signed)
Pt states he woke up at 0400 with bloody diarrhea. Pt states he has been to the restroom around 12-14 times this morning. Pt also c/o dizziness

## 2015-11-24 NOTE — ED Provider Notes (Signed)
CSN: [id number]     Arrival date & time 11/24/15  1130 History    Chief Complaint  Patient presents with  . Rectal Bleeding    (Consider location/radiation/quality/duration/timing/severity/associated sxs/prior Treatment) Patient is a 70 y.o. male presenting with hematochezia. The history is provided by the patient.  Rectal Bleeding Quality:  Bright red Amount:  Copious Duration:  7 hours Timing:  Intermittent Progression:  Unchanged Chronicity:  New Context: diarrhea   Context: not anal fissures, not constipation, not hemorrhoids, not rectal injury and not rectal pain   Similar prior episodes: no   Relieved by:  None tried Worsened by:  Nothing tried Ineffective treatments:  None tried Associated symptoms: light-headedness   Associated symptoms: no abdominal pain, no dizziness, no fever, no loss of consciousness and no vomiting   Risk factors: no anticoagulant use, no hx of colorectal cancer, no hx of colorectal surgery, no hx of IBD and no liver disease     Past Medical History  Diagnosis Date  . Hypertension   . 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 Pancreatic lesion     routinely followed by [facility]   Past Surgical History  Procedure Laterality Date  . Back surgery  1988  . Prostate surgery  2011   Family History  Problem Relation Age of Onset  . Cancer Father     prostate  . Cancer Brother     lung   Social History  Substance Use Topics  . Smoking status: Not on file  . Smokeless tobacco: Not on file  . Alcohol Use: Not on file    Review of Systems  Constitutional: Negative for fever.  Gastrointestinal: Positive for hematochezia. Negative for vomiting and abdominal pain.  Neurological: Positive for light-headedness. Negative for dizziness and loss of consciousness.  All other systems reviewed and are negative.   Allergies  Review of patient's allergies indicates no known allergies.  Home Medications   Prior to Admission  medications   Not on File   BP 134/79 mmHg  Pulse 94  Temp(Src) 98.1 F (36.7 C)  Resp 10  Ht 6\' 1"  (1.854 m)  Wt 111.131 kg  BMI 32.33 kg/m2  SpO2 97% Physical Exam  Constitutional: He is oriented to person, place, and time. He appears well-developed and well-nourished.  HENT:  Head: Normocephalic and atraumatic.  Eyes: Conjunctivae and EOM are normal. Pupils are equal, round, and reactive to light.  Neck: Normal range of motion. Neck supple.  Cardiovascular: Normal rate and regular rhythm.   Pulmonary/Chest: Effort normal and breath sounds normal. No respiratory distress. He has no wheezes. He has no rales.  Abdominal: Soft. Bowel sounds are normal. He exhibits no distension. There is no tenderness. There is no rebound and no guarding.  Genitourinary: Guaiac positive stool.  Musculoskeletal: Normal range of motion. He exhibits no edema or tenderness.  Lymphadenopathy:    He has no cervical adenopathy.  Neurological: He is alert and oriented to person, place, and time.  Skin: Skin is warm and dry.    ED Course  Procedures (including critical care time) Labs Review Labs Reviewed  COMPREHENSIVE METABOLIC PANEL - Abnormal; Notable for the following:    CO2 20 (*)    Glucose, Bld 115 (*)    Creatinine, Ser 1.45 (*)    Total Protein 5.8 (*)    Albumin 3.3 (*)    Alkaline Phosphatase 37 (*)    GFR calc non Af Amer 47 (*)  GFR calc Af Amer 55 (*)    All other components within normal limits  CBC WITH DIFFERENTIAL/PLATELET - Abnormal; Notable for the following:    Hemoglobin 9.7 (*)    HCT 30.6 (*)    MCV 60.7 (*)    MCH 19.2 (*)    RDW 16.8 (*)    All other components within normal limits  I-STAT CHEM 8, ED - Abnormal; Notable for the following:    Creatinine, Ser 1.30 (*)    Glucose, Bld 113 (*)    Hemoglobin 11.9 (*)    HCT 35.0 (*)    All other components within normal limits  POC OCCULT BLOOD, ED - Abnormal; Notable for the following:    Fecal Occult Bld  POSITIVE (*)    All other components within normal limits  I-STAT CG4 LACTIC ACID, ED - Abnormal; Notable for the following:    Lactic Acid, Venous 4.02 (*)    All other components within normal limits  LIPASE, BLOOD  PROTIME-INR  AMMONIA  PROTIME-INR  APTT  I-STAT CG4 LACTIC ACID, ED  I-STAT TROPOININ, ED  I-STAT CG4 LACTIC ACID, ED  I-STAT CG4 LACTIC ACID, ED  TYPE AND SCREEN  ABO/RH    Imaging Review Dg Chest Portable 1 View  11/24/2015  CLINICAL DATA:  Dizziness. EXAM: PORTABLE CHEST 1 VIEW COMPARISON:  December 20, 2008. FINDINGS: Stable cardiomediastinal silhouette. No pneumothorax or pleural effusion is noted. Both lungs are clear. The visualized skeletal structures are unremarkable. IMPRESSION: No acute cardiopulmonary abnormality seen. Electronically Signed   By: Marijo Conception, M.D.   On: 11/24/2015 13:33   Dg Abd Portable 1v  11/24/2015  CLINICAL DATA:  Patient with history of GI bleed. Bloody diarrhea. History of prostate cancer. EXAM: PORTABLE ABDOMEN - 1 VIEW COMPARISON:  None. FINDINGS: Gas is demonstrated within nondilated loops of large and small bowel in a nonobstructed pattern. The superior abdomen is excluded from view. Lumbar spine degenerative changes. No aggressive or acute appearing osseous lesions. IMPRESSION: Nonobstructed bowel gas pattern. Electronically Signed   By: Lovey Newcomer M.D.   On: 11/24/2015 13:37   I have personally reviewed and evaluated these images and lab results as part of my medical decision-making.   EKG Interpretation   Date/Time:  Thursday Nov 24 2015 12:04:29 EDT Ventricular Rate:  90 PR Interval:  153 QRS Duration: 135 QT Interval:  377 QTC Calculation: 461 R Axis:   12 Text Interpretation:  Sinus rhythm IVCD, consider atypical RBBB Abnormal  T, consider ischemia, lateral leads No previous ECGs available Confirmed  by Wyvonnia Dusky  MD, STEPHEN [id number]) on 11/24/2015 12:14:45 PM Also confirmed by  Wyvonnia Dusky  MD, STEPHEN [phone]), editor Rolla Plate,  Joelene Millin [phone])  on 11/24/2015  12:21:16 PM       MDM   Final diagnoses:  GI bleed   Discussed case with Dr. Oletta Lamas, who will see patient. Will give 1 unit of PRBCs. Lactic acid improved s/p 2L fluid bolus. Will obtain CT abdomen pelvis. Patient's vitals improved and normotensive. Patient is overall stable with two IVs in place. No recurrent GI bleeding since in the ED. Discussed case with Family Medicine and will admit to stepdown.    Mariel Aloe, MD 11/24/15 [place], MD 11/24/15 [phone]

## 2015-11-24 NOTE — ED Notes (Signed)
CT called to see if he could come to CT at this time.

## 2015-11-24 NOTE — ED Notes (Signed)
Report attempted. Unable to take at this time will follow up.

## 2015-11-24 NOTE — Progress Notes (Signed)
I have discussed with the inpatient team.  I will co sign the H&PE when available.  Briefly, 70 year old male with sudden onset today of bright red blood per rectum bleeding.  He has had a total of ~15 bowel movements seeming to contain a significant amount of blood.  Painless.  Takes ASA 81 mg daily.  No other blood thinners or GI irritants.  Does have some mild, intermitent GERD/dyspepsic symptoms.  Since in the ER, has only had 2 small BMs.  Colonoscopy in the last 2 years was normal?  One biopsy was taken and told 5 year follow up. Exam, Hemodynamically stable.  Hgb is reassuring, but will likely drop further since I doubt he has fully reequilibrated.   Bleeding seems to have slowed.   Likely lower GI bleed.  The odds would be for a diverticular bleed. Far less likely upper GI bleed given bright red appearance.   Support overnight.  Follow Hgb.  We have involved GI.  Likely repeat colonoscopy once bleeding has stopped.

## 2015-11-25 DIAGNOSIS — K922 Gastrointestinal hemorrhage, unspecified: Secondary | ICD-10-CM | POA: Insufficient documentation

## 2015-11-25 DIAGNOSIS — N179 Acute kidney failure, unspecified: Secondary | ICD-10-CM | POA: Diagnosis present

## 2015-11-25 DIAGNOSIS — K869 Disease of pancreas, unspecified: Secondary | ICD-10-CM | POA: Diagnosis not present

## 2015-11-25 DIAGNOSIS — E46 Unspecified protein-calorie malnutrition: Secondary | ICD-10-CM | POA: Diagnosis present

## 2015-11-25 DIAGNOSIS — I959 Hypotension, unspecified: Secondary | ICD-10-CM | POA: Diagnosis present

## 2015-11-25 DIAGNOSIS — K5791 Diverticulosis of intestine, part unspecified, without perforation or abscess with bleeding: Secondary | ICD-10-CM | POA: Diagnosis not present

## 2015-11-25 DIAGNOSIS — K219 Gastro-esophageal reflux disease without esophagitis: Secondary | ICD-10-CM | POA: Diagnosis present

## 2015-11-25 DIAGNOSIS — K921 Melena: Secondary | ICD-10-CM | POA: Diagnosis not present

## 2015-11-25 DIAGNOSIS — I10 Essential (primary) hypertension: Secondary | ICD-10-CM | POA: Diagnosis not present

## 2015-11-25 DIAGNOSIS — Z8546 Personal history of malignant neoplasm of prostate: Secondary | ICD-10-CM | POA: Diagnosis not present

## 2015-11-25 DIAGNOSIS — Z6832 Body mass index (BMI) 32.0-32.9, adult: Secondary | ICD-10-CM | POA: Diagnosis not present

## 2015-11-25 DIAGNOSIS — D62 Acute posthemorrhagic anemia: Secondary | ICD-10-CM | POA: Diagnosis not present

## 2015-11-25 DIAGNOSIS — D649 Anemia, unspecified: Secondary | ICD-10-CM | POA: Diagnosis not present

## 2015-11-25 LAB — APTT: aPTT: 27 seconds (ref 24–37)

## 2015-11-25 LAB — COMPREHENSIVE METABOLIC PANEL
ALT: 20 U/L (ref 17–63)
AST: 24 U/L (ref 15–41)
Albumin: 2.9 g/dL — ABNORMAL LOW (ref 3.5–5.0)
Alkaline Phosphatase: 31 U/L — ABNORMAL LOW (ref 38–126)
Anion gap: 7 (ref 5–15)
BUN: 11 mg/dL (ref 6–20)
CO2: 23 mmol/L (ref 22–32)
Calcium: 8.6 mg/dL — ABNORMAL LOW (ref 8.9–10.3)
Chloride: 110 mmol/L (ref 101–111)
Creatinine, Ser: 1 mg/dL (ref 0.61–1.24)
GFR calc Af Amer: >60 mL/min (ref >60)
GFR calc non Af Amer: >60 mL/min (ref >60)
Glucose, Bld: 103 mg/dL — ABNORMAL HIGH (ref 65–99)
Potassium: 3.7 mmol/L (ref 3.5–5.1)
Sodium: 140 mmol/L (ref 135–145)
Total Bilirubin: 1.1 mg/dL (ref 0.3–1.2)
Total Protein: 5.3 g/dL — ABNORMAL LOW (ref 6.5–8.1)

## 2015-11-25 LAB — CBC
HCT: 23.3 % — ABNORMAL LOW (ref 39.0–52.0)
Hemoglobin: 7.6 g/dL — ABNORMAL LOW (ref 13.0–17.0)
MCH: 20 pg — ABNORMAL LOW (ref 26.0–34.0)
MCHC: 32.6 g/dL (ref 30.0–36.0)
MCV: 61.3 fL — ABNORMAL LOW (ref 78.0–100.0)
Platelets: 170 10*3/uL (ref 150–400)
RBC: 3.8 MIL/uL — ABNORMAL LOW (ref 4.22–5.81)
RDW: 17.1 % — ABNORMAL HIGH (ref 11.5–15.5)
WBC: 6.6 10*3/uL (ref 4.0–10.5)

## 2015-11-25 LAB — IRON AND TIBC
Iron: 117 ug/dL (ref 45–182)
Saturation Ratios: 38 % (ref 17.9–39.5)
TIBC: 305 ug/dL (ref 250–450)
UIBC: 188 ug/dL

## 2015-11-25 LAB — HEMOGLOBIN AND HEMATOCRIT, BLOOD
HCT: 24.6 % — ABNORMAL LOW (ref 39.0–52.0)
HCT: 22.5 % — ABNORMAL LOW (ref 39.0–52.0)
Hemoglobin: 7.6 g/dL — ABNORMAL LOW (ref 13.0–17.0)
Hemoglobin: 7.3 g/dL — ABNORMAL LOW (ref 13.0–17.0)

## 2015-11-25 LAB — FERRITIN: Ferritin: 70 ng/mL (ref 24–336)

## 2015-11-25 LAB — PROTIME-INR
INR: 1.28 (ref 0.00–1.49)
Prothrombin Time: 16.1 seconds — ABNORMAL HIGH (ref 11.6–15.2)

## 2015-11-25 NOTE — Progress Notes (Signed)
Family Medicine Teaching Service Daily Progress Note Intern Pager: [phone]  Patient name: Gregory Odonnell Medical record number: [id number] Date of birth: 12/04/1945 Age: 70 y.o. Gender: male  Primary Care Provider: Foye Spurling, MD Consultants: GI Code Status: Full (per discussion on admission)  Pt Overview and Major Events to Date:  5/11: Admit to FPTS, CT abdomen  Assessment and Plan: Gregory Odonnell is a 70 y.o. male presenting with hematochezia. PMH is significant for HTN, prostate cancer, pancreatic lesion, and GI bleed.   GI bleed (bright red blood per rectum): BP stable. Likely differentials for lower GI bleeding include hemorrhoids vs diverticular hemorhage vs AVM vs neoplastic disease. Last colonoscopy was apparently 2 years ago which was reportedly benign. No hx of colon cancer, coagulapathy, or excessive NSAID use. CT Abdomen showing pancreatic mass, and large bowel diverticuli and ?iliac artery abnormality. Hgb low at 7.6 today. - Vital signs per floor protocol - GI consulted, appreciate their recommendations and assistance -clear liquid diet, Miralax -serial hemoglobins - mIVF: Normal saline @ 150 cc/hr - Continuous cardiac monitoring - hemoglobin/hematocrit q12 - No anticoagulation or DVT prophylaxis with active bleed - Likely lower GI bleed but will place on PPI BID until fully evaluated - Transfusion threshold hgb < 7   Hx of Hypertension: Symptomatic hypotension on admission. BP normal now at 131/72 after IVF. Home BP meds include Amlodipine 10 mg daily and Valsartan-HCTZ 320-12.5mg  daily.  - Hold home antihypertensives for now - If patient becomes hypertensive, may add home medications back  AKI: Resolved  FEN/GI: Normal saline @ 150 cc/hr (maintenance)/ Clears Prophylaxis: SCDs  Disposition: Home  Subjective:  Patient has no complaints this morning. Denies any dizziness or lightheadedness. Good appetite. Had 1 bloody BM this  morning.   Objective: Temp:  [98 F (36.7 C)-98.7 F (37.1 C)] 98.7 F (37.1 C) (05/11 2229) Pulse Rate:  [84-119] 94 (05/12 0508) Resp:  [10-22] 18 (05/12 0508) BP: (75-148)/(44-92) 131/67 mmHg (05/12 0508) SpO2:  [96 %-100 %] 100 % (05/12 0508) Weight:  [245 lb (111.131 kg)] 245 lb (111.131 kg) (05/11 1900) Physical Exam: General: In NAD, sitting up in chair eating breakfast Cardiovascular: tachycardic, regular rhythm, no edema. 2+ dorsalis pedis pulses bilaterally Respiratory: CTAB Abdomen: soft, nondistended, non tender, normal bowel sounds Extremities: no edema  Laboratory:  Recent Labs Lab 11/24/15 1156 11/24/15 1218 11/24/15 1656 11/25/15 0513  WBC 7.2  --   --  6.6  HGB 9.7* 11.9* 8.7* 7.6*  HCT 30.6* 35.0* 27.1* 23.3*  PLT 206  --   --  170    Recent Labs Lab 11/24/15 1156 11/24/15 1218 11/25/15 0513  NA 138 138 140  K 4.0 4.1 3.7  CL 105 104 110  CO2 20*  --  23  BUN 15 19 11   CREATININE 1.45* 1.30* 1.00  CALCIUM 9.5  --  8.6*  PROT 5.8*  --  5.3*  BILITOT 0.8  --  1.1  ALKPHOS 37*  --  31*  ALT 25  --  20  AST 31  --  24  GLUCOSE 115* 113* 103*    Imaging/Diagnostic Tests: Ct Abdomen Pelvis W Contrast  11/24/2015  CLINICAL DATA:  GI bleed. Hx prostate cancer, HTN, and pancreatic lesion. EXAM: CT ABDOMEN AND PELVIS WITH CONTRAST TECHNIQUE: Multidetector CT imaging of the abdomen and pelvis was performed using the standard protocol following bolus administration of intravenous contrast. CONTRAST:  100 mL Isovue-300 COMPARISON:  None. FINDINGS: Lower chest:  Negative Hepatobiliary: Negative Pancreas:  18 mm low-attenuation pancreas lesion at the junction of the body and tail showing average attenuation of about 0 Spleen: Negative Adrenals/Urinary Tract: 15 mm left renal cyst midpole. 5 mm low-attenuation lesion midpole right kidney too small to characterize possibly a cyst. No hydronephrosis or stones. Bladder negative. Stomach/Bowel: Mild diverticulosis  throughout the large bowel with no evidence of diverticulitis. No focal abnormalities involving large or small bowel. Appendix normal. Stomach normal except for small hiatal hernia. Vascular/Lymphatic: Minimal aortic calcification without dilatation. Mild iliac artery calcification bilaterally. Crescentic low-attenuation occupying about 50% of the lumen of the proximal common iliac artery on the right with possibilities including plaque or focus of chronic dissection. Internal and external iliac arteries opacified normally with contrast. Reproductive: No acute findings Other: No ascites Musculoskeletal: No acute findings IMPRESSION: 1. Indeterminate pancreatic lesion with possible causes including pseudocyst from prior inflammation as well as benign or malignant cystic neoplasm. Consider nonemergent pancreatic MRI. If prior studies are available these would be helpful. 2.  Mild large bowel diverticulosis 3. Right common iliac artery abnormality may represent short segment probably chronic dissection.l Electronically Signed   By: Skipper Cliche M.D.   On: 11/24/2015 19:21   Dg Chest Portable 1 View  11/24/2015  CLINICAL DATA:  Dizziness. EXAM: PORTABLE CHEST 1 VIEW COMPARISON:  December 20, 2008. FINDINGS: Stable cardiomediastinal silhouette. No pneumothorax or pleural effusion is noted. Both lungs are clear. The visualized skeletal structures are unremarkable. IMPRESSION: No acute cardiopulmonary abnormality seen. Electronically Signed   By: Marijo Conception, M.D.   On: 11/24/2015 13:33   Dg Abd Portable 1v  11/24/2015  CLINICAL DATA:  Patient with history of GI bleed. Bloody diarrhea. History of prostate cancer. EXAM: PORTABLE ABDOMEN - 1 VIEW COMPARISON:  None. FINDINGS: Gas is demonstrated within nondilated loops of large and small bowel in a nonobstructed pattern. The superior abdomen is excluded from view. Lumbar spine degenerative changes. No aggressive or acute appearing osseous lesions. IMPRESSION:  Nonobstructed bowel gas pattern. Electronically Signed   By: Lovey Newcomer M.D.   On: 11/24/2015 13:37     Carlyle Dolly, MD 11/25/2015, 7:07 AM PGY-1, [place] Intern pager: [phone], text pages welcome

## 2015-11-25 NOTE — Progress Notes (Signed)
[facility] PROGRESS NOTE Subjective I was able to access the patient's records from [facility] via care everywhere. He had colonoscopy 5/14 with marked diverticulosis of the left colon and a small inflammatory polyp was removed. He continues to have a small amount of blood and is beginning now to have loose stools.  Objective: Vital signs in last 24 hours: Temp:  [98 F (36.7 C)-98.7 F (37.1 C)] 98.7 F (37.1 C) (05/11 2229) Pulse Rate:  [87-103] 94 (05/12 0508) Resp:  [10-18] 18 (05/12 0508) BP: (110-148)/(66-92) 131/67 mmHg (05/12 0508) SpO2:  [96 %-100 %] 100 % (05/12 0508) Weight:  [111.131 kg (245 lb)] 111.131 kg (245 lb) (05/11 1900) Last BM Date: 11/25/15  Intake/Output from previous day:   Intake/Output this shift:    PE: General-- no acute distress - Abdomen-- soft and nontender  Lab Results:  Recent Labs  11/24/15 1156 11/24/15 1218 11/24/15 1656 11/25/15 0513  WBC 7.2  --   --  6.6  HGB 9.7* 11.9* 8.7* 7.6*  HCT 30.6* 35.0* 27.1* 23.3*  PLT 206  --   --  170   BMET  Recent Labs  11/24/15 1156 11/24/15 1218 11/25/15 0513  NA 138 138 140  K 4.0 4.1 3.7  CL 105 104 110  CO2 20*  --  23  CREATININE 1.45* 1.30* 1.00   LFT  Recent Labs  11/24/15 1156 11/25/15 0513  PROT 5.8* 5.3*  AST 31 24  ALT 25 20  ALKPHOS 37* 31*  BILITOT 0.8 1.1   PT/INR  Recent Labs  11/24/15 1156 11/24/15 1656 11/25/15 0513  LABPROT 15.0 15.1 16.1*  INR 1.16 1.17 1.28   PANCREAS  Recent Labs  11/24/15 1156  LIPASE 21         Studies/Results: Ct Abdomen Pelvis W Contrast  11/24/2015  CLINICAL DATA:  GI bleed. Hx prostate cancer, HTN, and pancreatic lesion. EXAM: CT ABDOMEN AND PELVIS WITH CONTRAST TECHNIQUE: Multidetector CT imaging of the abdomen and pelvis was performed using the standard protocol following bolus administration of intravenous contrast. CONTRAST:  100 mL Isovue-300 COMPARISON:  None. FINDINGS: Lower  chest:  Negative Hepatobiliary: Negative Pancreas: 18 mm low-attenuation pancreas lesion at the junction of the body and tail showing average attenuation of about 0 Spleen: Negative Adrenals/Urinary Tract: 15 mm left renal cyst midpole. 5 mm low-attenuation lesion midpole right kidney too small to characterize possibly a cyst. No hydronephrosis or stones. Bladder negative. Stomach/Bowel: Mild diverticulosis throughout the large bowel with no evidence of diverticulitis. No focal abnormalities involving large or small bowel. Appendix normal. Stomach normal except for small hiatal hernia. Vascular/Lymphatic: Minimal aortic calcification without dilatation. Mild iliac artery calcification bilaterally. Crescentic low-attenuation occupying about 50% of the lumen of the proximal common iliac artery on the right with possibilities including plaque or focus of chronic dissection. Internal and external iliac arteries opacified normally with contrast. Reproductive: No acute findings Other: No ascites Musculoskeletal: No acute findings IMPRESSION: 1. Indeterminate pancreatic lesion with possible causes including pseudocyst from prior inflammation as well as benign or malignant cystic neoplasm. Consider nonemergent pancreatic MRI. If prior studies are available these would be helpful. 2.  Mild large bowel diverticulosis 3. Right common iliac artery abnormality may represent short segment probably chronic dissection.l Electronically Signed   By: Skipper Cliche M.D.   On: 11/24/2015 19:21   Dg Chest Portable 1 View  11/24/2015  CLINICAL DATA:  Dizziness. EXAM: PORTABLE CHEST 1 VIEW COMPARISON:  December 20, 2008. FINDINGS:  Stable cardiomediastinal silhouette. No pneumothorax or pleural effusion is noted. Both lungs are clear. The visualized skeletal structures are unremarkable. IMPRESSION: No acute cardiopulmonary abnormality seen. Electronically Signed   By: Marijo Conception, M.D.   On: 11/24/2015 13:33   Dg Abd Portable  1v  11/24/2015  CLINICAL DATA:  Patient with history of GI bleed. Bloody diarrhea. History of prostate cancer. EXAM: PORTABLE ABDOMEN - 1 VIEW COMPARISON:  None. FINDINGS: Gas is demonstrated within nondilated loops of large and small bowel in a nonobstructed pattern. The superior abdomen is excluded from view. Lumbar spine degenerative changes. No aggressive or acute appearing osseous lesions. IMPRESSION: Nonobstructed bowel gas pattern. Electronically Signed   By: Lovey Newcomer M.D.   On: 11/24/2015 13:37    Medications: I have reviewed the patient's current medications.  Assessment/Plan: 1. Lower G.I. bleed. Patient notes small amount of bright blood with stool but feels that it is clearing up. This is probably diverticular bleed. With continued treatment with Miralax and supportive care. We will follow.   Pricsilla Lindvall JR,Johann Gascoigne L 11/25/2015, 12:35 PM  This note was created using voice recognition software. Minor errors may Have occurred unintentionally.  Pager: [phone] If no answer or after hours call [phone]

## 2015-11-25 NOTE — Care Management Obs Status (Signed)
[facility] NOTIFICATION   Patient Details  Name: Gregory Odonnell MRN: [id number] Date of Birth: Apr 22, 1946   Medicare Observation Status Notification Given:  Yes   [place], RN 11/25/2015, 1:06 PM

## 2015-11-26 LAB — HEMOGLOBIN AND HEMATOCRIT, BLOOD
HCT: 21.1 % — ABNORMAL LOW (ref 39.0–52.0)
HCT: 28.8 % — ABNORMAL LOW (ref 39.0–52.0)
Hemoglobin: 6.8 g/dL — CL (ref 13.0–17.0)
Hemoglobin: 9.2 g/dL — ABNORMAL LOW (ref 13.0–17.0)

## 2015-11-26 LAB — PREPARE RBC (CROSSMATCH)

## 2015-11-26 MED ORDER — SODIUM CHLORIDE 0.9 % IV SOLN: Freq: Once | INTRAVENOUS | Status: DC

## 2015-11-26 MED ORDER — AMLODIPINE BESYLATE 10 MG PO TABS
10.0000 mg | ORAL_TABLET | Freq: Every day | ORAL | Status: DC
  Administered 2015-11-26: 10 mg via ORAL
  Filled 2015-11-26: qty 1

## 2015-11-26 NOTE — Progress Notes (Signed)
Family Medicine Teaching Service Daily Progress Note Intern Pager: [phone]  Patient name: Gregory Odonnell Medical record number: [id number] Date of birth: 1946/07/03 Age: 70 y.o. Gender: male  Primary Care Provider: Foye Spurling, MD Consultants: GI Code Status: Full (per discussion on admission)  Pt Overview and Major Events to Date:  5/11: Admit to FPTS, CT abdomen  Assessment and Plan: Gregory Odonnell is a 70 y.o. male presenting with hematochezia. PMH is significant for HTN, prostate cancer, pancreatic lesion, and GI bleed.   GI bleed (bright red blood per rectum): BP stable. No bleeding overnight. Last blood per rectum was yesterday morning. Hgb low at 6.8 overnight.  - Vital signs per floor protocol - GI consulted, appreciate their recommendations and assistance -advance diet  -If hgb stable after transfusions, may DC from GI standpoint - DC mIVF - Continuous cardiac monitoring - Transfuse 2 U of pRBC -  Follow up post transfusion H&H - Will send home if bleeding resolves and hemoglobin becomes   Hx of Hypertension: Symptomatic hypotension on admission. BP normal now and beginning to be hypertensive to [id number] systolic. Home BP meds include Amlodipine 10 mg daily and Valsartan-HCTZ 320-12.5mg  daily.  - Add back home amlodipine as BP is beginning to rise  AKI: Resolved  FEN/GI: Normal saline @ 150 cc/hr (maintenance)/ Clears Prophylaxis: SCDs  Disposition: Home  Subjective:  Patient has no complaints this morning. Denies any dizziness or lightheadedness. Good appetite. Has not had a bloody BM in 24 hours.   Objective: Temp:  [97.6 F (36.4 C)-98.2 F (36.8 C)] 97.6 F (36.4 C) (05/13 0528) Pulse Rate:  [61-104] 103 (05/13 0528) Resp:  [17-20] 20 (05/13 0528) BP: (134-151)/(78-83) 138/81 mmHg (05/13 0528) SpO2:  [97 %-100 %] 98 % (05/13 0528) Physical Exam: General: In NAD, sitting up in chair eating breakfast Cardiovascular:  tachycardic, regular rhythm, no edema. 2+ dorsalis pedis pulses bilaterally Respiratory: CTAB Abdomen: soft, nondistended, non tender, normal bowel sounds Extremities: no edema  Laboratory:  Recent Labs Lab 11/24/15 1156  11/25/15 0513 11/25/15 1204 11/25/15 1838 11/26/15 0017  WBC 7.2  --  6.6  --   --   --   HGB 9.7*  < > 7.6* 7.6* 7.3* 6.8*  HCT 30.6*  < > 23.3* 24.6* 22.5* 21.1*  PLT 206  --  170  --   --   --   < > = values in this interval not displayed.  Recent Labs Lab 11/24/15 1156 11/24/15 1218 11/25/15 0513  NA 138 138 140  K 4.0 4.1 3.7  CL 105 104 110  CO2 20*  --  23  BUN 15 19 11   CREATININE 1.45* 1.30* 1.00  CALCIUM 9.5  --  8.6*  PROT 5.8*  --  5.3*  BILITOT 0.8  --  1.1  ALKPHOS 37*  --  31*  ALT 25  --  20  AST 31  --  24  GLUCOSE 115* 113* 103*    Imaging/Diagnostic Tests: No results found.   Carlyle Dolly, MD 11/26/2015, 7:20 AM PGY-1, [place] Intern pager: [phone], text pages welcome

## 2015-11-26 NOTE — Discharge Instructions (Signed)
You were admitted to the hospital for a lower GI bleed which caused you to be anemic. You received some blood and your anemia has improved. The GI doctor's have seen you and you are stable for discharge. Likely you were bleeding from diverticulosis. It seems as though your bleeding has stopped and you are not stable to go home.   Please return to the hospital if you have a fever, are throwing up blood, have rectal bleeding, if you have blood in your stools, if you feel dizzy, or if you feel lightheaded.   I would recommend not taking Aspirin as that can make you more prone to bleeding.   It was a pleasure caring for you, take care!  Diverticulosis Diverticulosis is the condition that develops when small pouches (diverticula) form in the wall of your colon. Your colon, or large intestine, is where water is absorbed and stool is formed. The pouches form when the inside layer of your colon pushes through weak spots in the outer layers of your colon. CAUSES  No one knows exactly what causes diverticulosis. RISK FACTORS  Being older than 58. Your risk for this condition increases with age. Diverticulosis is rare in people younger than 40 years. By age 54, almost everyone has it.  Eating a low-fiber diet.  Being frequently constipated.  Being overweight.  Not getting enough exercise.  Smoking.  Taking over-the-counter pain medicines, like aspirin and ibuprofen. SYMPTOMS  Most people with diverticulosis do not have symptoms. DIAGNOSIS  Because diverticulosis often has no symptoms, health care providers often discover the condition during an exam for other colon problems. In many cases, a health care provider will diagnose diverticulosis while using a flexible scope to examine the colon (colonoscopy). TREATMENT  If you have never developed an infection related to diverticulosis, you may not need treatment. If you have had an infection before, treatment may include:  Eating more fruits,  vegetables, and grains.  Taking a fiber supplement.  Taking a live bacteria supplement (probiotic).  Taking medicine to relax your colon. HOME CARE INSTRUCTIONS   Drink at least 6-8 glasses of water each day to prevent constipation.  Try not to strain when you have a bowel movement.  Keep all follow-up appointments. If you have had an infection before:  Increase the fiber in your diet as directed by your health care provider or dietitian.  Take a dietary fiber supplement if your health care provider approves.  Only take medicines as directed by your health care provider. SEEK MEDICAL CARE IF:   You have abdominal pain.  You have bloating.  You have cramps.  You have not gone to the bathroom in 3 days. SEEK IMMEDIATE MEDICAL CARE IF:   Your pain gets worse.  Yourbloating becomes very bad.  You have a fever or chills, and your symptoms suddenly get worse.  You begin vomiting.  You have bowel movements that are bloody or black. MAKE SURE YOU:  Understand these instructions.  Will watch your condition.  Will get help right away if you are not doing well or get worse.   This information is not intended to replace advice given to you by your health care provider. Make sure you discuss any questions you have with your health care provider.   Document Released: 03/29/2004 Document Revised: 07/07/2013 Document Reviewed: 05/27/2013 Elsevier Interactive Patient Education [employer].

## 2015-11-26 NOTE — Discharge Summary (Signed)
[facility] Discharge Summary  Patient name: Gregory Odonnell Medical record number: [id number] Date of birth: 1946/04/19 Age: 70 y.o. Gender: male Date of Admission: 11/24/2015  Date of Discharge: 11/26/15 Admitting Physician: Zenia Resides, MD  Primary Care Provider: Foye Spurling, MD Consultants: GI  Indication for Hospitalization: Hematochezia  Discharge Diagnoses/Problem List:  Patient Active Problem List   Diagnosis Date Noted  . Protein-calorie malnutrition ([place]) 11/25/2015  . Acute lower GI bleeding   . GI bleed 11/24/2015  . HTN (hypertension) 11/24/2015  . History of prostate cancer 11/24/2015  . Pancreatic lesion 11/24/2015  . GI bleeding 11/24/2015  . Bright red blood per rectum   . Symptomatic anemia   . Acute blood loss anemia     Disposition: Home  Discharge Condition: stable  Discharge Exam: see previous progress note  Brief Hospital Course:  Gregory Odonnell is a 70 y.o. male who presented with hematochezia. PMH is significant for HTN, prostate cancer, pancreatic lesion, and GI bleed.   GI bleed (bright red blood per rectum):  Patient was hypotensive on admission and admitted to several bowel movements with blood prior to ED presentation. Home antihypertensives were held. He was given IVF and placed on clear liquid diet. GI was consulted. CT Abdomen performed showing pancreatic mass, and large bowel diverticuli and ?iliac artery abnormality. Hgb was trended and dropped to 6.8 on day after admission. He received 2 units of pRBCs and hgb improved to 9.3. BP stablized and antihypertensive medications were resumed. GI determined that bleeding was likely from diverticulosis and no further intervention was needed at this time. Upon discharge, patient was hematochezia free for ~36 hours and his vital signs were stable.    Issues for Follow Up:  1. Follow up with PCP, ASA stopped on DC.  2. If any further GI bleeding, may consider  GI consult  Significant Procedures: None  Significant Labs and Imaging:   Recent Labs Lab 11/24/15 1156  11/25/15 0513  11/25/15 1838 11/26/15 0017 11/26/15 1534  WBC 7.2  --  6.6  --   --   --   --   HGB 9.7*  < > 7.6*  < > 7.3* 6.8* 9.2*  HCT 30.6*  < > 23.3*  < > 22.5* 21.1* 28.8*  PLT 206  --  170  --   --   --   --   < > = values in this interval not displayed.  Recent Labs Lab 11/24/15 1156 11/24/15 1218 11/25/15 0513  NA 138 138 140  K 4.0 4.1 3.7  CL 105 104 110  CO2 20*  --  23  GLUCOSE 115* 113* 103*  BUN 15 19 11   CREATININE 1.45* 1.30* 1.00  CALCIUM 9.5  --  8.6*  ALKPHOS 37*  --  31*  AST 31  --  24  ALT 25  --  20  ALBUMIN 3.3*  --  2.9*      Results/Tests Pending at Time of Discharge: None  Discharge Medications:    Medication List    STOP taking these medications        aspirin EC 81 MG tablet      TAKE these medications        amLODipine 10 MG tablet  Commonly known as:  NORVASC  Take 10 mg by mouth daily.     multivitamin with minerals tablet  Take 1 tablet by mouth daily.     valsartan-hydrochlorothiazide 320-12.5 MG tablet  Commonly  known as:  DIOVAN-HCT  Take 1 tablet by mouth daily.     Vitamin D3 2000 units capsule  Take 2,000 Units by mouth daily.        Discharge Instructions: Please refer to Patient Instructions section of EMR for full details.  Patient was counseled important signs and symptoms that should prompt return to medical care, changes in medications, dietary instructions, activity restrictions, and follow up appointments.   Follow-Up Appointments:     Follow-up Information    Schedule an appointment as soon as possible for a visit with Foye Spurling, MD.   Specialty:  Internal Medicine   Why:  hospital follow up   Contact information:   [address] [place] [address] [phone]       Carlyle Dolly, MD 11/26/2015, 5:58 PM PGY-1, [place]

## 2015-11-26 NOTE — Progress Notes (Signed)
Subjective: No bleeding for past 36 hours. No abdominal pain. Is hungry.  Objective: Vital signs in last 24 hours: Temp:  [97.6 F (36.4 C)-98.2 F (36.8 C)] 97.6 F (36.4 C) (05/13 0528) Pulse Rate:  [61-104] 103 (05/13 0528) Resp:  [17-20] 20 (05/13 0528) BP: (134-151)/(78-83) 138/81 mmHg (05/13 0528) SpO2:  [97 %-100 %] 98 % (05/13 0528) Weight change:  Last BM Date: 11/25/15  PE: GEN:  Much younger-appearing than stated age, NAD ABD:  Soft, non-tender  Lab Results: CBC    Component Value Date/Time   WBC 6.6 11/25/2015 0513   RBC 3.80* 11/25/2015 0513   HGB 6.8* 11/26/2015 0017   HCT 21.1* 11/26/2015 0017   PLT 170 11/25/2015 0513   MCV 61.3* 11/25/2015 0513   MCH 20.0* 11/25/2015 0513   MCHC 32.6 11/25/2015 0513   RDW 17.1* 11/25/2015 0513   LYMPHSABS 0.9 11/24/2015 1156   MONOABS 0.4 11/24/2015 1156   EOSABS 0.0 11/24/2015 1156   BASOSABS 0.0 11/24/2015 1156   CMP     Component Value Date/Time   NA 140 11/25/2015 0513   K 3.7 11/25/2015 0513   CL 110 11/25/2015 0513   CO2 23 11/25/2015 0513   GLUCOSE 103* 11/25/2015 0513   BUN 11 11/25/2015 0513   CREATININE 1.00 11/25/2015 0513   CALCIUM 8.6* 11/25/2015 0513   PROT 5.3* 11/25/2015 0513   ALBUMIN 2.9* 11/25/2015 0513   AST 24 11/25/2015 0513   ALT 20 11/25/2015 0513   ALKPHOS 31* 11/25/2015 0513   BILITOT 1.1 11/25/2015 0513   GFRNONAA >60 11/25/2015 0513   GFRAA >60 11/25/2015 0513   Assessment:  1.  Hematochezia, resolved, suspect from colonic diverticulosis. 2.  Anemia, acute blood loss.  No ongoing bleeding, but interval decrease in Hgb likely from hemodilution and equilibrative changes.  Plan:  1.  Receiving blood products today. 2.  Advance diet. 3.  If Hgb stabilizes, and still no further bleeding, would consider discharge home tomorrow from GI perspective. 4.  [facility] will follow.   Landry Dyke 11/26/2015, 9:55 AM   Pager [phone] If no answer or after 5 PM call  [phone]

## 2015-11-27 LAB — TYPE AND SCREEN
ABO/RH(D): O POS
Antibody Screen: NEGATIVE
Unit division: 0
Unit division: 0

## 2015-11-28 DIAGNOSIS — K922 Gastrointestinal hemorrhage, unspecified: Secondary | ICD-10-CM | POA: Diagnosis not present

## 2015-11-28 DIAGNOSIS — K5792 Diverticulitis of intestine, part unspecified, without perforation or abscess without bleeding: Secondary | ICD-10-CM | POA: Diagnosis not present

## 2015-11-28 DIAGNOSIS — I10 Essential (primary) hypertension: Secondary | ICD-10-CM | POA: Diagnosis not present

## 2015-11-28 DIAGNOSIS — K5701 Diverticulitis of small intestine with perforation and abscess with bleeding: Secondary | ICD-10-CM | POA: Diagnosis not present

## 2015-12-21 DIAGNOSIS — D649 Anemia, unspecified: Secondary | ICD-10-CM | POA: Diagnosis not present

## 2015-12-21 DIAGNOSIS — K922 Gastrointestinal hemorrhage, unspecified: Secondary | ICD-10-CM | POA: Diagnosis not present

## 2015-12-21 DIAGNOSIS — I10 Essential (primary) hypertension: Secondary | ICD-10-CM | POA: Diagnosis not present

## 2015-12-21 DIAGNOSIS — K5790 Diverticulosis of intestine, part unspecified, without perforation or abscess without bleeding: Secondary | ICD-10-CM | POA: Diagnosis not present

## 2016-01-26 DIAGNOSIS — I10 Essential (primary) hypertension: Secondary | ICD-10-CM | POA: Diagnosis not present

## 2016-01-26 DIAGNOSIS — M654 Radial styloid tenosynovitis [de Quervain]: Secondary | ICD-10-CM | POA: Diagnosis not present

## 2016-01-26 DIAGNOSIS — E559 Vitamin D deficiency, unspecified: Secondary | ICD-10-CM | POA: Diagnosis not present

## 2016-01-26 DIAGNOSIS — D649 Anemia, unspecified: Secondary | ICD-10-CM | POA: Diagnosis not present

## 2016-01-26 DIAGNOSIS — R7302 Impaired glucose tolerance (oral): Secondary | ICD-10-CM | POA: Diagnosis not present

## 2016-01-26 DIAGNOSIS — M2550 Pain in unspecified joint: Secondary | ICD-10-CM | POA: Diagnosis not present

## 2016-01-26 DIAGNOSIS — Z125 Encounter for screening for malignant neoplasm of prostate: Secondary | ICD-10-CM | POA: Diagnosis not present

## 2016-01-30 DIAGNOSIS — M65341 Trigger finger, right ring finger: Secondary | ICD-10-CM | POA: Diagnosis not present

## 2016-03-02 DIAGNOSIS — M65341 Trigger finger, right ring finger: Secondary | ICD-10-CM | POA: Diagnosis not present

## 2016-04-13 DIAGNOSIS — M65341 Trigger finger, right ring finger: Secondary | ICD-10-CM | POA: Diagnosis not present

## 2016-04-26 DIAGNOSIS — Z8546 Personal history of malignant neoplasm of prostate: Secondary | ICD-10-CM | POA: Diagnosis not present

## 2016-06-12 DIAGNOSIS — D649 Anemia, unspecified: Secondary | ICD-10-CM | POA: Diagnosis not present

## 2016-06-12 DIAGNOSIS — I10 Essential (primary) hypertension: Secondary | ICD-10-CM | POA: Diagnosis not present

## 2016-07-05 ENCOUNTER — Other Ambulatory Visit: Payer: Self-pay | Admitting: Internal Medicine

## 2016-07-05 ENCOUNTER — Encounter (INDEPENDENT_AMBULATORY_CARE_PROVIDER_SITE_OTHER): Payer: Self-pay

## 2016-07-05 ENCOUNTER — Ambulatory Visit (HOSPITAL_COMMUNITY)
Admission: RE | Admit: 2016-07-05 | Discharge: 2016-07-05 | Disposition: A | Discharge status: Home / Self Care | Payer: Medicare Other | Source: Ambulatory Visit | Attending: Internal Medicine | Admitting: Internal Medicine

## 2016-07-05 DIAGNOSIS — R079 Chest pain, unspecified: Secondary | ICD-10-CM | POA: Insufficient documentation

## 2016-07-05 DIAGNOSIS — D649 Anemia, unspecified: Secondary | ICD-10-CM | POA: Diagnosis not present

## 2016-07-05 DIAGNOSIS — R52 Pain, unspecified: Secondary | ICD-10-CM

## 2016-07-05 DIAGNOSIS — I10 Essential (primary) hypertension: Secondary | ICD-10-CM | POA: Diagnosis not present

## 2016-08-23 ENCOUNTER — Other Ambulatory Visit: Payer: Self-pay | Admitting: Internal Medicine

## 2016-08-23 DIAGNOSIS — R9431 Abnormal electrocardiogram [ECG] [EKG]: Secondary | ICD-10-CM

## 2016-08-24 ENCOUNTER — Ambulatory Visit: Payer: Medicare Other | Admitting: Cardiology

## 2016-08-29 ENCOUNTER — Ambulatory Visit (HOSPITAL_COMMUNITY)
Admission: RE | Admit: 2016-08-29 | Discharge: 2016-08-29 | Disposition: A | Discharge status: Home / Self Care | Payer: Medicare Other | Source: Ambulatory Visit | Attending: Internal Medicine | Admitting: Internal Medicine

## 2016-08-29 DIAGNOSIS — I10 Essential (primary) hypertension: Secondary | ICD-10-CM | POA: Diagnosis not present

## 2016-08-29 DIAGNOSIS — R9431 Abnormal electrocardiogram [ECG] [EKG]: Secondary | ICD-10-CM | POA: Diagnosis not present

## 2016-08-29 DIAGNOSIS — I351 Nonrheumatic aortic (valve) insufficiency: Secondary | ICD-10-CM | POA: Insufficient documentation

## 2016-08-29 NOTE — Progress Notes (Signed)
  Echocardiogram 2D Echocardiogram has been performed.  Jennette Dubin 08/29/2016, 9:24 AM

## 2016-10-09 DIAGNOSIS — E559 Vitamin D deficiency, unspecified: Secondary | ICD-10-CM | POA: Diagnosis not present

## 2016-10-09 DIAGNOSIS — I10 Essential (primary) hypertension: Secondary | ICD-10-CM | POA: Diagnosis not present

## 2016-10-09 DIAGNOSIS — D649 Anemia, unspecified: Secondary | ICD-10-CM | POA: Diagnosis not present

## 2016-10-09 DIAGNOSIS — Z125 Encounter for screening for malignant neoplasm of prostate: Secondary | ICD-10-CM | POA: Diagnosis not present

## 2016-10-09 DIAGNOSIS — Z8719 Personal history of other diseases of the digestive system: Secondary | ICD-10-CM | POA: Diagnosis not present

## 2017-04-16 DIAGNOSIS — Z23 Encounter for immunization: Secondary | ICD-10-CM | POA: Diagnosis not present

## 2017-04-16 DIAGNOSIS — Q391 Atresia of esophagus with tracheo-esophageal fistula: Secondary | ICD-10-CM | POA: Diagnosis not present

## 2017-04-16 DIAGNOSIS — K862 Cyst of pancreas: Secondary | ICD-10-CM | POA: Diagnosis not present

## 2017-04-30 DIAGNOSIS — Z08 Encounter for follow-up examination after completed treatment for malignant neoplasm: Secondary | ICD-10-CM | POA: Diagnosis not present

## 2017-04-30 DIAGNOSIS — I10 Essential (primary) hypertension: Secondary | ICD-10-CM | POA: Diagnosis not present

## 2017-04-30 DIAGNOSIS — Z8546 Personal history of malignant neoplasm of prostate: Secondary | ICD-10-CM | POA: Diagnosis not present

## 2017-04-30 DIAGNOSIS — E559 Vitamin D deficiency, unspecified: Secondary | ICD-10-CM | POA: Diagnosis not present

## 2017-04-30 DIAGNOSIS — D649 Anemia, unspecified: Secondary | ICD-10-CM | POA: Diagnosis not present

## 2017-04-30 DIAGNOSIS — M2550 Pain in unspecified joint: Secondary | ICD-10-CM | POA: Diagnosis not present

## 2017-04-30 DIAGNOSIS — R7302 Impaired glucose tolerance (oral): Secondary | ICD-10-CM | POA: Diagnosis not present

## 2017-12-25 DIAGNOSIS — C61 Malignant neoplasm of prostate: Secondary | ICD-10-CM | POA: Diagnosis not present

## 2017-12-25 DIAGNOSIS — Z6834 Body mass index (BMI) 34.0-34.9, adult: Secondary | ICD-10-CM | POA: Diagnosis not present

## 2017-12-25 DIAGNOSIS — J301 Allergic rhinitis due to pollen: Secondary | ICD-10-CM | POA: Diagnosis not present

## 2017-12-25 DIAGNOSIS — I10 Essential (primary) hypertension: Secondary | ICD-10-CM | POA: Diagnosis not present

## 2017-12-25 DIAGNOSIS — Z833 Family history of diabetes mellitus: Secondary | ICD-10-CM | POA: Diagnosis not present

## 2017-12-25 DIAGNOSIS — D170 Benign lipomatous neoplasm of skin and subcutaneous tissue of head, face and neck: Secondary | ICD-10-CM | POA: Diagnosis not present

## 2017-12-25 DIAGNOSIS — C672 Malignant neoplasm of lateral wall of bladder: Secondary | ICD-10-CM | POA: Diagnosis not present

## 2017-12-26 DIAGNOSIS — Z0000 Encounter for general adult medical examination without abnormal findings: Secondary | ICD-10-CM | POA: Diagnosis not present

## 2018-01-08 DIAGNOSIS — Z1211 Encounter for screening for malignant neoplasm of colon: Secondary | ICD-10-CM | POA: Diagnosis not present

## 2018-01-08 DIAGNOSIS — Z1212 Encounter for screening for malignant neoplasm of rectum: Secondary | ICD-10-CM | POA: Diagnosis not present

## 2018-01-31 DIAGNOSIS — I10 Essential (primary) hypertension: Secondary | ICD-10-CM | POA: Diagnosis not present

## 2018-01-31 DIAGNOSIS — D539 Nutritional anemia, unspecified: Secondary | ICD-10-CM | POA: Diagnosis not present

## 2018-01-31 DIAGNOSIS — D649 Anemia, unspecified: Secondary | ICD-10-CM | POA: Diagnosis not present

## 2018-02-10 DIAGNOSIS — R195 Other fecal abnormalities: Secondary | ICD-10-CM | POA: Diagnosis not present

## 2018-02-10 DIAGNOSIS — K5730 Diverticulosis of large intestine without perforation or abscess without bleeding: Secondary | ICD-10-CM | POA: Diagnosis not present

## 2018-02-10 DIAGNOSIS — R933 Abnormal findings on diagnostic imaging of other parts of digestive tract: Secondary | ICD-10-CM | POA: Diagnosis not present

## 2018-02-10 DIAGNOSIS — I10 Essential (primary) hypertension: Secondary | ICD-10-CM | POA: Diagnosis not present

## 2018-02-10 IMAGING — DX DG ABD PORTABLE 1V
2 series · 2 of 2 positions shown · non-contrast
Comparison: None.

CLINICAL DATA: Patient with history of GI bleed. Bloody diarrhea.
History of prostate cancer.

EXAM:
PORTABLE ABDOMEN - 1 VIEW

[abdomen supine (1 of 2)]
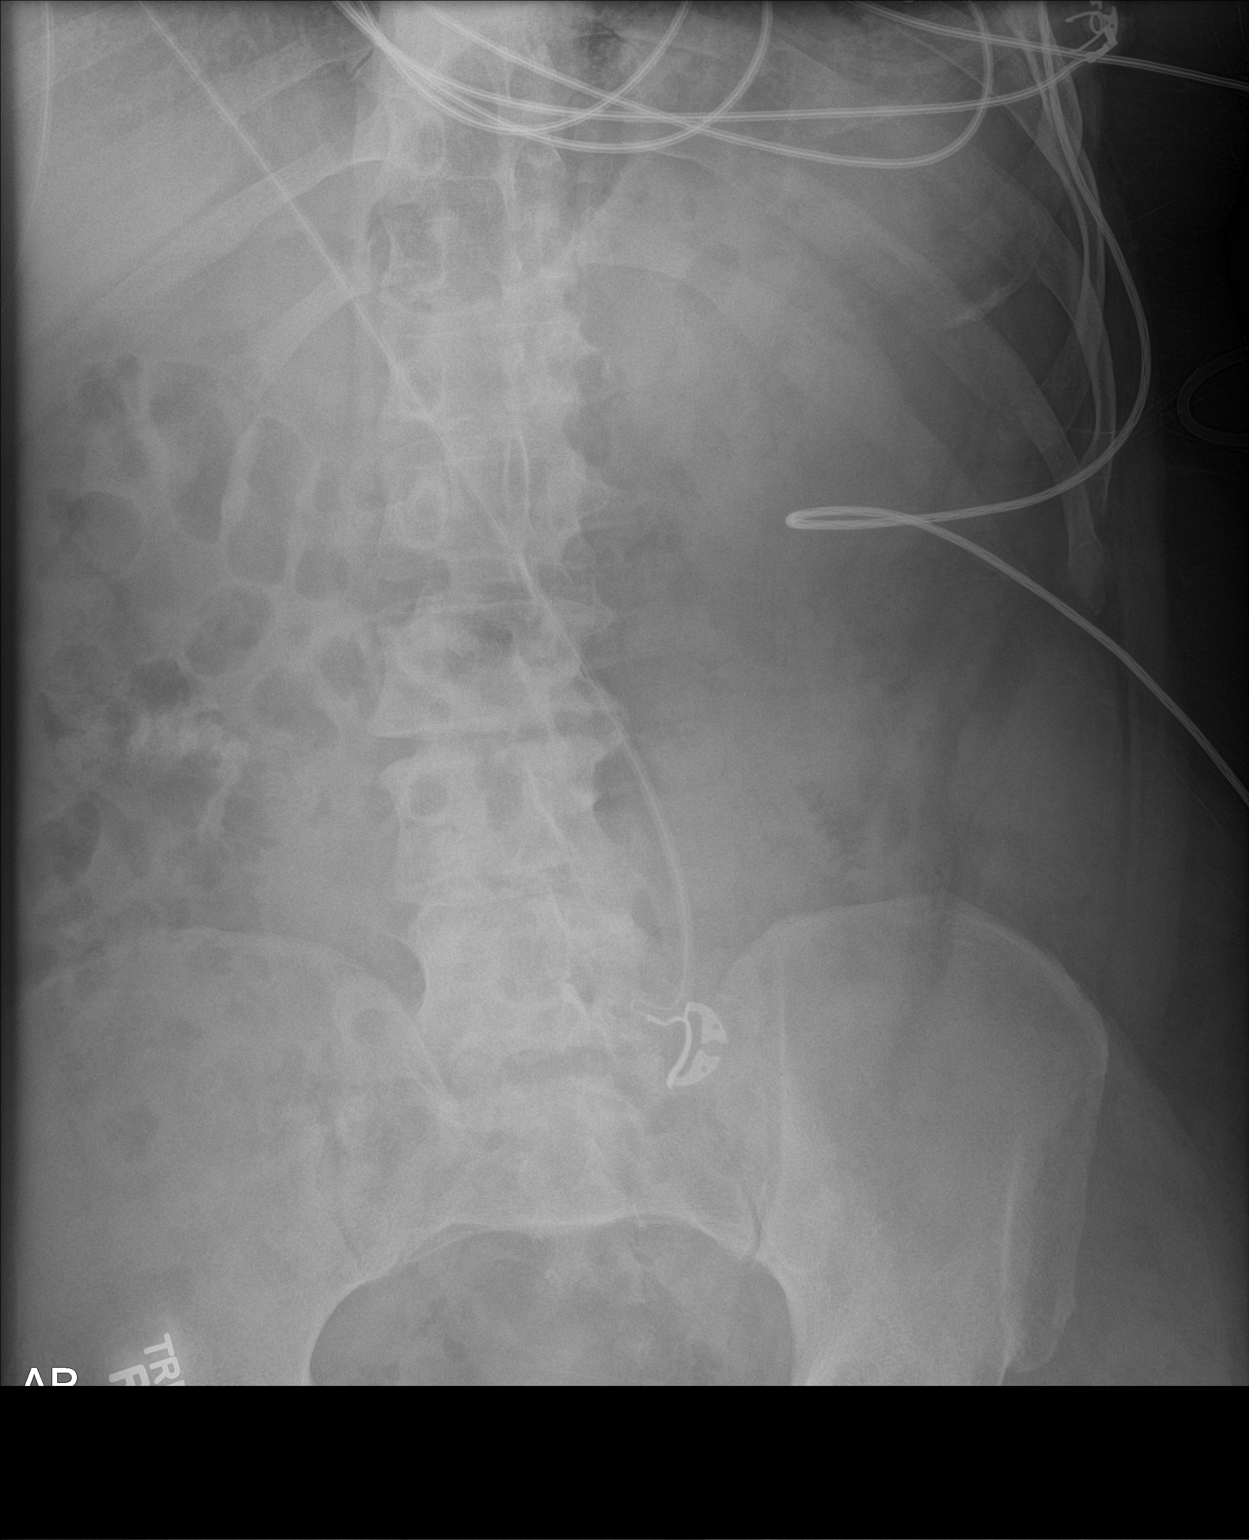

[abdomen supine (2 of 2)]
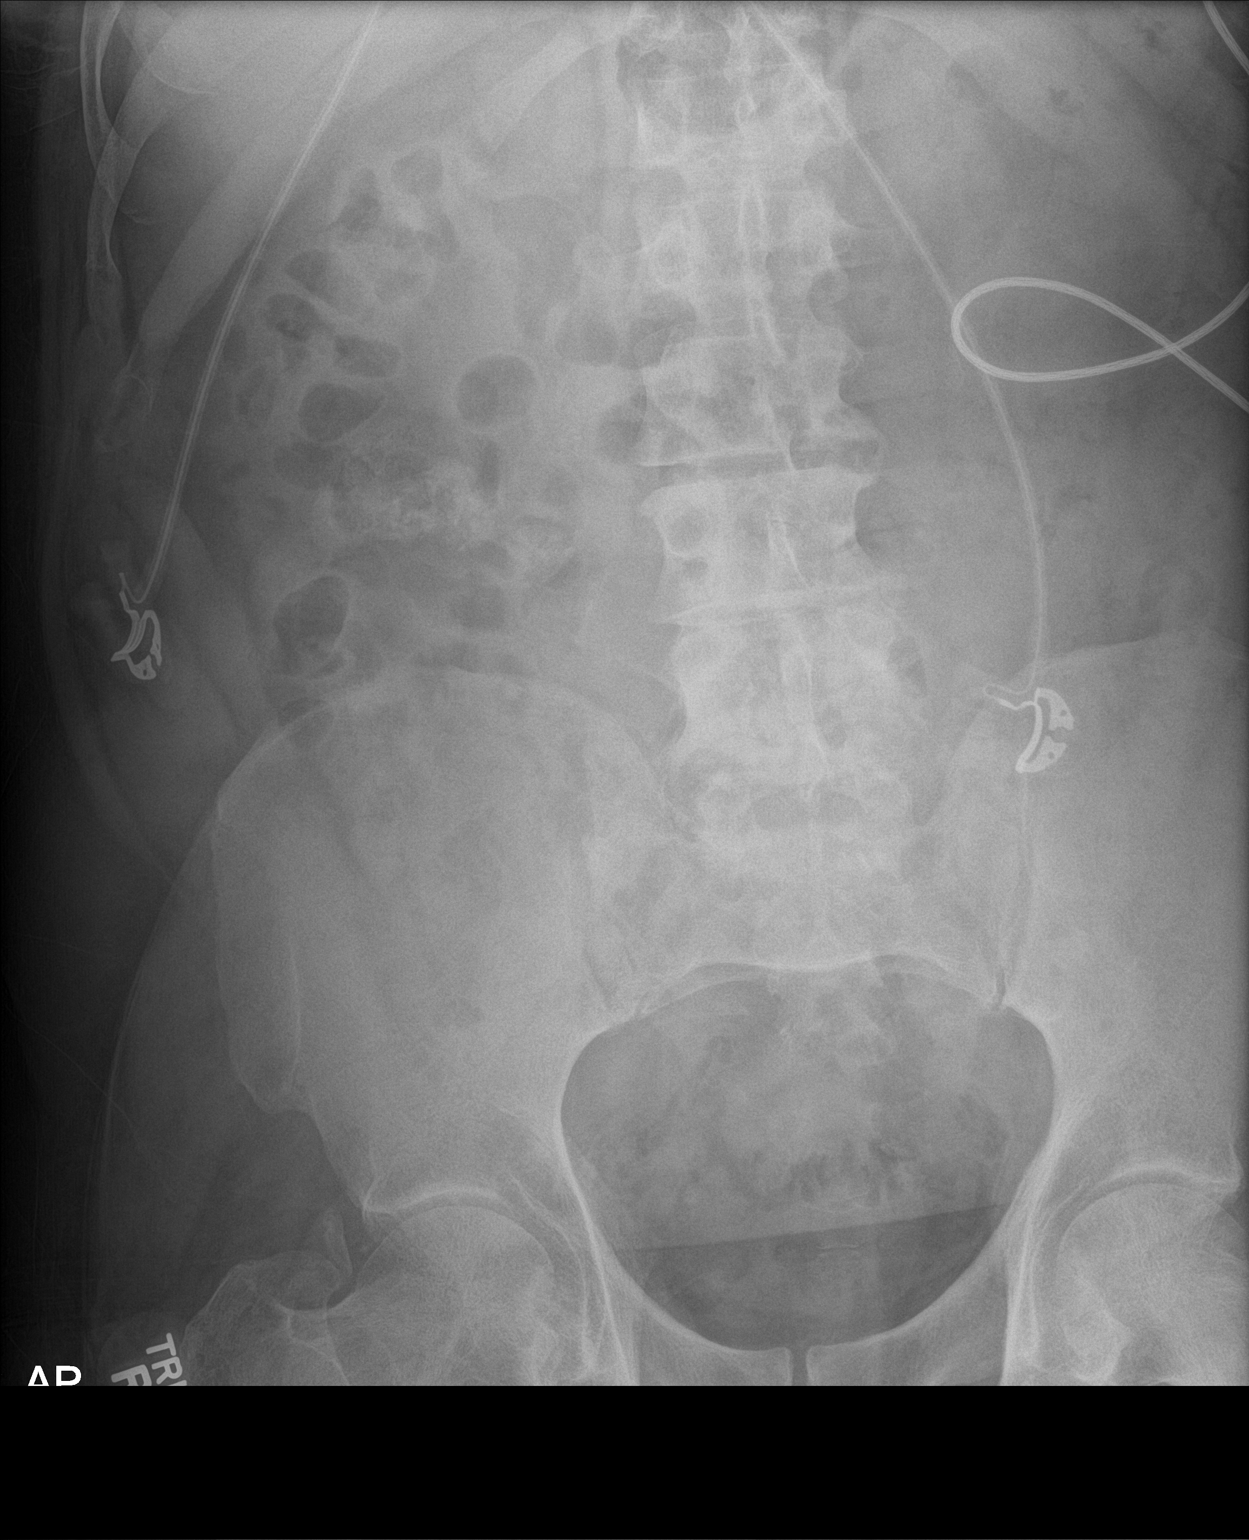

[2 of 2 positions shown; findings below may reference images not displayed]

FINDINGS: Gas is demonstrated within nondilated loops of large and small bowel
in a nonobstructed pattern. The superior abdomen is excluded from
view. Lumbar spine degenerative changes. No aggressive or acute
appearing osseous lesions.
IMPRESSION: Nonobstructed bowel gas pattern.

## 2018-02-13 DIAGNOSIS — R195 Other fecal abnormalities: Secondary | ICD-10-CM | POA: Diagnosis not present

## 2018-02-13 DIAGNOSIS — K5730 Diverticulosis of large intestine without perforation or abscess without bleeding: Secondary | ICD-10-CM | POA: Diagnosis not present

## 2018-02-13 DIAGNOSIS — K635 Polyp of colon: Secondary | ICD-10-CM | POA: Diagnosis not present

## 2018-02-13 DIAGNOSIS — D175 Benign lipomatous neoplasm of intra-abdominal organs: Secondary | ICD-10-CM | POA: Diagnosis not present

## 2018-02-13 DIAGNOSIS — D124 Benign neoplasm of descending colon: Secondary | ICD-10-CM | POA: Diagnosis not present

## 2018-04-02 DIAGNOSIS — Z6835 Body mass index (BMI) 35.0-35.9, adult: Secondary | ICD-10-CM | POA: Diagnosis not present

## 2018-04-02 DIAGNOSIS — I10 Essential (primary) hypertension: Secondary | ICD-10-CM | POA: Diagnosis not present

## 2018-04-11 ENCOUNTER — Observation Stay (HOSPITAL_COMMUNITY)
Admission: EM | Admit: 2018-04-11 | Discharge: 2018-04-13 | Disposition: A | Discharge status: Home / Self Care | Payer: Medicare Other | Attending: Internal Medicine | Admitting: Internal Medicine

## 2018-04-11 ENCOUNTER — Encounter (HOSPITAL_COMMUNITY): Payer: Self-pay | Admitting: Emergency Medicine

## 2018-04-11 ENCOUNTER — Other Ambulatory Visit: Payer: Self-pay

## 2018-04-11 DIAGNOSIS — K625 Hemorrhage of anus and rectum: Secondary | ICD-10-CM | POA: Diagnosis not present

## 2018-04-11 DIAGNOSIS — I10 Essential (primary) hypertension: Secondary | ICD-10-CM | POA: Diagnosis not present

## 2018-04-11 DIAGNOSIS — Z801 Family history of malignant neoplasm of trachea, bronchus and lung: Secondary | ICD-10-CM | POA: Insufficient documentation

## 2018-04-11 DIAGNOSIS — Z23 Encounter for immunization: Secondary | ICD-10-CM | POA: Insufficient documentation

## 2018-04-11 DIAGNOSIS — Z8546 Personal history of malignant neoplasm of prostate: Secondary | ICD-10-CM

## 2018-04-11 DIAGNOSIS — D649 Anemia, unspecified: Secondary | ICD-10-CM | POA: Diagnosis not present

## 2018-04-11 DIAGNOSIS — Z8042 Family history of malignant neoplasm of prostate: Secondary | ICD-10-CM | POA: Insufficient documentation

## 2018-04-11 DIAGNOSIS — Z79899 Other long term (current) drug therapy: Secondary | ICD-10-CM | POA: Insufficient documentation

## 2018-04-11 DIAGNOSIS — Z7982 Long term (current) use of aspirin: Secondary | ICD-10-CM | POA: Insufficient documentation

## 2018-04-11 DIAGNOSIS — M1990 Unspecified osteoarthritis, unspecified site: Secondary | ICD-10-CM | POA: Diagnosis not present

## 2018-04-11 LAB — COMPREHENSIVE METABOLIC PANEL
ALT: 26 U/L (ref 0–44)
AST: 28 U/L (ref 15–41)
Albumin: 3.7 g/dL (ref 3.5–5.0)
Alkaline Phosphatase: 46 U/L (ref 38–126)
Anion gap: 10 (ref 5–15)
BUN: 13 mg/dL (ref 8–23)
CO2: 24 mmol/L (ref 22–32)
Calcium: 9.3 mg/dL (ref 8.9–10.3)
Chloride: 103 mmol/L (ref 98–111)
Creatinine, Ser: 1.01 mg/dL (ref 0.61–1.24)
GFR calc Af Amer: >60 mL/min (ref >60)
GFR calc non Af Amer: >60 mL/min (ref >60)
Glucose, Bld: 109 mg/dL — ABNORMAL HIGH (ref 70–99)
Potassium: 4 mmol/L (ref 3.5–5.1)
Sodium: 137 mmol/L (ref 135–145)
Total Bilirubin: 0.8 mg/dL (ref 0.3–1.2)
Total Protein: 7.1 g/dL (ref 6.5–8.1)

## 2018-04-11 LAB — CBC
HCT: 36.8 % — ABNORMAL LOW (ref 39.0–52.0)
Hemoglobin: 11.4 g/dL — ABNORMAL LOW (ref 13.0–17.0)
MCH: 19.4 pg — ABNORMAL LOW (ref 26.0–34.0)
MCHC: 31 g/dL (ref 30.0–36.0)
MCV: 62.7 fL — ABNORMAL LOW (ref 78.0–100.0)
Platelets: 261 10*3/uL (ref 150–400)
RBC: 5.87 MIL/uL — ABNORMAL HIGH (ref 4.22–5.81)
RDW: 17.2 % — ABNORMAL HIGH (ref 11.5–15.5)
WBC: 6.3 10*3/uL (ref 4.0–10.5)

## 2018-04-11 LAB — POC OCCULT BLOOD, ED: Fecal Occult Bld: NEGATIVE

## 2018-04-11 NOTE — ED Provider Notes (Signed)
TIME SEEN: 11:56 PM  CHIEF COMPLAINT: Rectal bleeding  HPI: Patient is a 72 year old male with history of pretension, diverticulosis previous GI bleed requiring transfusion who presents to the emergency department who presents to the emergency department with rectal bleeding for the past several days.  States he noticed an increase in rectal bleeding today.  Has felt lightheaded but no chest pain or shortness of breath.  On aspirin 81 mg daily but no other antiplatelet or anticoagulant.  Last colonoscopy was done by Dr. Almyra Free on August 19 which showed diverticulosis.  He denies abdominal pain.  No vomiting.  Has had to have transfusion before for GI bleed.  ROS: See HPI Constitutional: no fever  Eyes: no drainage  ENT: no runny nose   Cardiovascular:  no chest pain  Resp: no SOB  GI: no vomiting GU: no dysuria Integumentary: no rash  Allergy: no hives  Musculoskeletal: no leg swelling  Neurological: no slurred speech ROS otherwise negative  PAST MEDICAL HISTORY/PAST SURGICAL HISTORY:  Past Medical History:  Diagnosis Date  . Arthritis    "maybe in my shoulders and one of my fingers on right hands" (11/24/2015)  . Hypertension   . Pancreatic lesion    routinely followed by [facility]  . Prostate cancer [facility])    prostate w/ ?mets to bladder  . Seasonal asthma    "sometimes in May"    MEDICATIONS:  Prior to Admission medications   Medication Sig Start Date End Date Taking? Authorizing Provider  amLODipine (NORVASC) 10 MG tablet Take 10 mg by mouth daily. 02/22/14   [provider]  Cholecalciferol (VITAMIN D3) 2000 units capsule Take 2,000 Units by mouth daily.    [provider]  Multiple Vitamins-Minerals (MULTIVITAMIN WITH MINERALS) tablet Take 1 tablet by mouth daily.    [provider]  valsartan-hydrochlorothiazide (DIOVAN-HCT) 320-12.5 MG tablet Take 1 tablet by mouth daily. 03/23/14   [provider]    ALLERGIES:  No Known  Allergies  SOCIAL HISTORY:  Social History   Tobacco Use  . Smoking status: Never Smoker  . Smokeless tobacco: Never Used  Substance Use Topics  . Alcohol use: Yes    Comment: 11/24/2015 "I drink once q 6-7 months"    FAMILY HISTORY: Family History  Problem Relation Age of Onset  . Cancer Father        prostate  . Cancer Brother        lung    EXAM: BP (!) 148/84   Pulse (!) 106   Temp 97.9 F (36.6 C) (Oral)   Resp 20   Ht 6\' 1"  (1.854 m)   Wt 111.1 kg   SpO2 98%   BMI 32.32 kg/m  CONSTITUTIONAL: Alert and oriented and responds appropriately to questions. Well-appearing; well-nourished HEAD: Normocephalic EYES: Conjunctivae clear, pupils appear equal, EOMI ENT: normal nose; moist mucous membranes NECK: Supple, no meningismus, no nuchal rigidity, no LAD  CARD: Regular and tachycardic; S1 and S2 appreciated; no murmurs, no clicks, no rubs, no gallops RESP: Normal chest excursion without splinting or tachypnea; breath sounds clear and equal bilaterally; no wheezes, no rhonchi, no rales, no hypoxia or respiratory distress, speaking full sentences ABD/GI: Normal bowel sounds; non-distended; soft, non-tender, no rebound, no guarding, no peritoneal signs, no hepatosplenomegaly RECTAL:  Normal rectal tone, large amount of maroon-colored blood on rectal exam, no melena, nonthrombosed external hemorrhoids on exam, nontender rectal exam, no fecal impaction BACK:  The back appears normal and is non-tender to palpation, there is  no CVA tenderness EXT: Normal ROM in all joints; non-tender to palpation; no edema; normal capillary refill; no cyanosis, no calf tenderness or swelling    SKIN: Normal color for age and race; warm; no rash NEURO: Moves all extremities equally PSYCH: The patient's mood and manner are appropriate. Grooming and personal hygiene are appropriate.  MEDICAL DECISION MAKING: Patient here with rectal bleeding.  Suspect diverticular bleed.  Hemoglobin 11.4.  Mildly  tachycardic but otherwise hemodynamically stable with benign abdominal exam.  Will admit for observation.  PCP Dr. Criss Rosales.  ED PROGRESS: 12:17 AM Discussed patient's case with hospitalist, Dr. Jonelle Sidle.  I have recommended admission and patient (and family if present) agree with this plan. Admitting physician will place admission orders.   I reviewed all nursing notes, vitals, pertinent previous records, EKGs, lab and urine results, imaging (as available).       Johnnye Sandford, Delice Bison, DO 04/12/18 [phone]

## 2018-04-11 NOTE — ED Triage Notes (Signed)
Pt reports having bright red blood with a BM this morning. Pt denies N/V/D, abdominal pain, or syncope. Pt reports hx of colon pouches with hx of rectal bleeding in the past.

## 2018-04-12 DIAGNOSIS — K625 Hemorrhage of anus and rectum: Secondary | ICD-10-CM | POA: Diagnosis present

## 2018-04-12 LAB — COMPREHENSIVE METABOLIC PANEL
ALT: 27 U/L (ref 0–44)
AST: 26 U/L (ref 15–41)
Albumin: 3.9 g/dL (ref 3.5–5.0)
Alkaline Phosphatase: 49 U/L (ref 38–126)
Anion gap: 8 (ref 5–15)
BUN: 12 mg/dL (ref 8–23)
CO2: 25 mmol/L (ref 22–32)
Calcium: 9.7 mg/dL (ref 8.9–10.3)
Chloride: 104 mmol/L (ref 98–111)
Creatinine, Ser: 0.96 mg/dL (ref 0.61–1.24)
GFR calc Af Amer: >60 mL/min (ref >60)
GFR calc non Af Amer: >60 mL/min (ref >60)
Glucose, Bld: 114 mg/dL — ABNORMAL HIGH (ref 70–99)
Potassium: 4 mmol/L (ref 3.5–5.1)
Sodium: 137 mmol/L (ref 135–145)
Total Bilirubin: 0.9 mg/dL (ref 0.3–1.2)
Total Protein: 7.4 g/dL (ref 6.5–8.1)

## 2018-04-12 LAB — CBC
HCT: 37.6 % — ABNORMAL LOW (ref 39.0–52.0)
HCT: 34.7 % — ABNORMAL LOW (ref 39.0–52.0)
HCT: 35.8 % — ABNORMAL LOW (ref 39.0–52.0)
Hemoglobin: 11.8 g/dL — ABNORMAL LOW (ref 13.0–17.0)
Hemoglobin: 10.8 g/dL — ABNORMAL LOW (ref 13.0–17.0)
Hemoglobin: 11.1 g/dL — ABNORMAL LOW (ref 13.0–17.0)
MCH: 19.6 pg — ABNORMAL LOW (ref 26.0–34.0)
MCH: 19.6 pg — ABNORMAL LOW (ref 26.0–34.0)
MCH: 19.5 pg — ABNORMAL LOW (ref 26.0–34.0)
MCHC: 31.4 g/dL (ref 30.0–36.0)
MCHC: 31.1 g/dL (ref 30.0–36.0)
MCHC: 31 g/dL (ref 30.0–36.0)
MCV: 62.4 fL — ABNORMAL LOW (ref 78.0–100.0)
MCV: 63 fL — ABNORMAL LOW (ref 78.0–100.0)
MCV: 62.9 fL — ABNORMAL LOW (ref 78.0–100.0)
Platelets: 271 10*3/uL (ref 150–400)
Platelets: 241 10*3/uL (ref 150–400)
Platelets: 246 10*3/uL (ref 150–400)
RBC: 6.03 MIL/uL — ABNORMAL HIGH (ref 4.22–5.81)
RBC: 5.51 MIL/uL (ref 4.22–5.81)
RBC: 5.69 MIL/uL (ref 4.22–5.81)
RDW: 17.4 % — ABNORMAL HIGH (ref 11.5–15.5)
RDW: 17.1 % — ABNORMAL HIGH (ref 11.5–15.5)
RDW: 17 % — ABNORMAL HIGH (ref 11.5–15.5)
WBC: 7.3 10*3/uL (ref 4.0–10.5)
WBC: 5.6 10*3/uL (ref 4.0–10.5)
WBC: 5.7 10*3/uL (ref 4.0–10.5)

## 2018-04-12 LAB — POC OCCULT BLOOD, ED: Fecal Occult Bld: POSITIVE — AB

## 2018-04-12 MED ORDER — SODIUM CHLORIDE 0.9 % IV SOLN
8.0000 mg/h | INTRAVENOUS | Status: DC
  Administered 2018-04-12 (×2): 8 mg/h via INTRAVENOUS
  Filled 2018-04-12 (×3): qty 80

## 2018-04-12 MED ORDER — SODIUM CHLORIDE 0.9% IV SOLUTION: Freq: Once | INTRAVENOUS | Status: DC

## 2018-04-12 MED ORDER — SODIUM CHLORIDE 0.9 % IV SOLN: INTRAVENOUS | Status: DC

## 2018-04-12 MED ORDER — PANTOPRAZOLE SODIUM 40 MG IV SOLR: 40.0000 mg | Freq: Two times a day (BID) | INTRAVENOUS | Status: DC

## 2018-04-12 MED ORDER — SODIUM CHLORIDE 0.9 % IV SOLN
80.0000 mg | Freq: Once | INTRAVENOUS | Status: AC
  Administered 2018-04-12: 80 mg via INTRAVENOUS
  Filled 2018-04-12: qty 80

## 2018-04-12 MED ORDER — PNEUMOCOCCAL VAC POLYVALENT 25 MCG/0.5ML IJ INJ
0.5000 mL | INJECTION | INTRAMUSCULAR | Status: AC
  Administered 2018-04-13: 0.5 mL via INTRAMUSCULAR
  Filled 2018-04-12: qty 0.5

## 2018-04-12 MED ORDER — INFLUENZA VAC SPLIT HIGH-DOSE 0.5 ML IM SUSY
0.5000 mL | PREFILLED_SYRINGE | INTRAMUSCULAR | Status: AC
  Administered 2018-04-13: 0.5 mL via INTRAMUSCULAR
  Filled 2018-04-12: qty 0.5

## 2018-04-12 MED ORDER — PANTOPRAZOLE SODIUM 40 MG IV SOLR
40.0000 mg | Freq: Two times a day (BID) | INTRAVENOUS | Status: DC
  Administered 2018-04-12 – 2018-04-13 (×2): 40 mg via INTRAVENOUS
  Filled 2018-04-12 (×2): qty 40

## 2018-04-12 NOTE — Progress Notes (Signed)
PROGRESS NOTE    Gregory Odonnell  YQM:[id number] DOB: May 22, 1946 DOA: 04/11/2018 PCP: Foye Spurling, MD    Brief Narrative: Gregory Odonnell is a 72 y.o. male with medical history significant of recurrent rectal bleed secondary to diverticular disease who was last seen by Dr Carol Ada  in August of this year with colonoscopy showing diverticular disease.  He was transfused during that..  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  He came to the ER with significant rectal bleed again today.  Denied any nonsteroidal anti-inflammatory agents.  Not on any anticoagulants.  Patient has history of hypertension mainly and has been taking his medications.  No pain.  He takes aspirin 81 mg only.  Patient also reported lightheadedness and dizziness.  Hemoglobin appears stable however with bright red blood per rectum and previous history of GI bleed requiring transfusion patient is being admitted to the hospital for observation and work-up.  ED Course: Temperature is 98.5 blood pressure 172/96 was pulse 106 respiratory of 20 oxygen sat 90% room air.  White count is 6.3 hemoglobin 11.4 platelets 261.  Sodium 137 potassium 4.0 chloride 103 CO2 24 BUN 39 creatinine 1.01.  Glucose is 109.  Patient has grossly bloody stool with occult blood positive.  Assessment & Plan:   Principal Problem:   Rectal bleed Active Problems:   HTN (hypertension)   History of prostate cancer   Symptomatic anemia   1-Rectal bleed, diverticular bleed.  Monitor hb. Continue with clear diet.  Report less amount of blood in the stool today.  Discussed with DR Man, with GI. Recommend transfusion as need, no need for colonoscopy.  Hold aspirin.   HTN;  Monitor, holding BP medication in setting of GI bleed.  Holding Norvasc, irbesartan.   Anemia; acute blood loss.    DVT prophylaxis: SCD Code Status: Full code.  Family  Communication: care discussed with wife Disposition Plan: remain in the hospital on clear diet, monitor hb.    Consultants:   Dr Man,    Procedures:  none  Antimicrobials: none  Subjective: Mild cramping pain right side.  Had small BM today with less blood.    Objective: Vitals:   04/12/18 0145 04/12/18 0254 04/12/18 0300 04/12/18 0539  BP: 138/83 (!) 145/78  (!) 161/85  Pulse: 89 92  98  Resp: 14 18  18   Temp:  98 F (36.7 C)  98.8 F (37.1 C)  TempSrc:  Oral  Oral  SpO2: 97% 98%  98%  Weight:   112.5 kg   Height:   6\' 1"  (1.854 m)     Intake/Output Summary (Last 24 hours) at 04/12/2018 1024 Last data filed at 04/12/2018 0951 Gross per 24 hour  Intake 376.44 ml  Output 700 ml  Net -323.56 ml   Filed Weights   04/11/18 1733 04/12/18 0300  Weight: 111.1 kg 112.5 kg    Examination:  General exam: Appears calm and comfortable  Respiratory system: Clear to auscultation. Respiratory effort normal. Cardiovascular system: S1 & S2 heard, RRR. No JVD, murmurs, rubs, gallops or clicks. No pedal edema. Gastrointestinal system: Abdomen is nondistended, soft and nontender. No organomegaly or masses felt. Normal bowel sounds heard. Central nervous system: Alert and oriented. No focal neurological deficits. Extremities: Symmetric 5 x 5 power. Skin: No rashes, lesions or ulcers Psychiatry: Judgement and insight appear normal. Mood & affect appropriate.     Data  Reviewed: I have personally reviewed following labs and imaging studies  CBC: Recent Labs  Lab 04/11/18 1747 04/12/18 0241 04/12/18 0709  WBC 6.3 7.3 5.6  HGB 11.4* 11.8* 10.8*  HCT 36.8* 37.6* 34.7*  MCV 62.7* 62.4* 63.0*  PLT 261 271 790   Basic Metabolic Panel: Recent Labs  Lab 04/11/18 1747 04/12/18 0241  NA 137 137  K 4.0 4.0  CL 103 104  CO2 24 25  GLUCOSE 109* 114*  BUN 13 12  CREATININE 1.01 0.96  CALCIUM 9.3 9.7   GFR: Estimated Creatinine Clearance: 91.4 mL/min (by C-G formula  based on SCr of 0.96 mg/dL). Liver Function Tests: Recent Labs  Lab 04/11/18 1747 04/12/18 0241  AST 28 26  ALT 26 27  ALKPHOS 46 49  BILITOT 0.8 0.9  PROT 7.1 7.4  ALBUMIN 3.7 3.9   No results for input(s): LIPASE, AMYLASE in the last 168 hours. No results for input(s): AMMONIA in the last 168 hours. Coagulation Profile: No results for input(s): INR, PROTIME in the last 168 hours. Cardiac Enzymes: No results for input(s): CKTOTAL, CKMB, CKMBINDEX, TROPONINI in the last 168 hours. BNP (last 3 results) No results for input(s): PROBNP in the last 8760 hours. HbA1C: No results for input(s): HGBA1C in the last 72 hours. CBG: No results for input(s): GLUCAP in the last 168 hours. Lipid Profile: No results for input(s): CHOL, HDL, LDLCALC, TRIG, CHOLHDL, LDLDIRECT in the last 72 hours. Thyroid Function Tests: No results for input(s): TSH, T4TOTAL, FREET4, T3FREE, THYROIDAB in the last 72 hours. Anemia Panel: No results for input(s): VITAMINB12, FOLATE, FERRITIN, TIBC, IRON, RETICCTPCT in the last 72 hours. Sepsis Labs: No results for input(s): PROCALCITON, LATICACIDVEN in the last 168 hours.  No results found for this or any previous visit (from the past 240 hour(s)).       Radiology Studies: No results found.      Scheduled Meds: . sodium chloride   Intravenous Once  . [START ON 04/13/2018] Influenza vac split quadrivalent PF  0.5 mL Intramuscular Tomorrow-1000  . [START ON 04/15/2018] pantoprazole  40 mg Intravenous Q12H  . [START ON 04/13/2018] pneumococcal 23 valent vaccine  0.5 mL Intramuscular Tomorrow-1000   Continuous Infusions: . sodium chloride    . pantoprozole (PROTONIX) infusion 8 mg/hr (04/12/18 0454)     LOS: 0 days    Time spent: 35 minutes    Elmarie Shiley, MD [facility] Pager [phone]  If 7PM-7AM, please contact night-coverage [email or web address] Password [facility] 04/12/2018, 10:24 AM

## 2018-04-12 NOTE — Progress Notes (Signed)
Patient  transferred  To [facility] from ED. Patient alert and oriented. Admission assessment complete. CCMD notified. No complaints at this time. Will continue to monitor patient.

## 2018-04-12 NOTE — H&P (Signed)
History and Physical   DAX MURGUIA BSJ:[id number] DOB: [birth year] DOA: 04/11/2018  Referring MD/NP/PA: Dr ward  PCP: Foye Spurling, MD   Outpatient Specialists: Dr Benson Norway, GI   Patient coming from: Home  Chief Complaint: Rectal bleed  HPI: Gregory Odonnell is a 72 y.o. male with medical history significant of recurrent rectal bleed secondary to diverticular disease who was last seen by Dr Carol Ada  in August of this year with colonoscopy showing diverticular disease.  He was transfused during that..  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  He came to the ER with significant rectal bleed again today.  Denied any nonsteroidal anti-inflammatory agents.  Not on any anticoagulants.  Patient has history of hypertension mainly and has been taking his medications.  No pain.  He takes aspirin 81 mg only.  Patient also reported lightheadedness and dizziness.  Hemoglobin appears stable however with bright red blood per rectum and previous history of GI bleed requiring transfusion patient is being admitted to the hospital for observation and work-up.  ED Course: Temperature is 98.5 blood pressure 172/96 was pulse 106 respiratory of 20 oxygen sat 90% room air.  White count is 6.3 hemoglobin 11.4 platelets 261.  Sodium 137 potassium 4.0 chloride 103 CO2 24 BUN 39 creatinine 1.01.  Glucose is 109.  Patient has grossly bloody stool with occult blood positive. Review of Systems: As per HPI otherwise 10 point review of systems negative.    Past Medical History:  Diagnosis Date  . Arthritis    "maybe in my shoulders and one of my fingers on right hands" (11/24/2015)  . Hypertension   . Pancreatic lesion    routinely followed by [facility]  . 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 Seasonal asthma    "sometimes in May"    Past Surgical History:  Procedure Laterality Date  .  BACK SURGERY    . BLADDER SURGERY    . [place]   "herniated disc"  . PROSTATECTOMY  2011  . TONSILLECTOMY  1950s     reports that he has never smoked. He has never used smokeless tobacco. He reports that he drinks alcohol. He reports that he has current or past drug history. Drug: Marijuana.  No Known Allergies  Family History  Problem Relation Age of Onset  . Cancer Father        prostate  . Cancer Brother        lung     Prior to Admission medications   Medication Sig Start Date End Date Taking? Authorizing Provider  amLODipine (NORVASC) 10 MG tablet Take 10 mg by mouth daily. 02/22/14   [provider]  Cholecalciferol (VITAMIN D3) 2000 units capsule Take 2,000 Units by mouth daily.    [provider]  Multiple Vitamins-Minerals (MULTIVITAMIN WITH MINERALS) tablet Take 1 tablet by mouth daily.    [provider]  valsartan-hydrochlorothiazide (DIOVAN-HCT) 320-12.5 MG tablet Take 1 tablet by mouth daily. 03/23/14   [provider]    Physical Exam: Vitals:   04/11/18 1733 04/11/18 2001 04/11/18 2210  BP: (!) 172/96 (!) 151/87 (!) 148/84  Pulse: 98 (!) 101 (!) 106  Resp: 16 18 20   Temp: 98.5 F (36.9 C) 97.9 F (36.6 C) 97.9 F (36.6 C)  TempSrc: Oral Oral Oral  SpO2: 97% 95% 98%  Weight: 111.1 kg    Height: 6\' 1"  (1.854 m)        Constitutional: NAD, calm, comfortable Vitals:   04/11/18 1733 04/11/18 2001 04/11/18 2210  BP: (!) 172/96 (!) 151/87 (!) 148/84  Pulse: 98 (!) 101 (!) 106  Resp: 16 18 20   Temp: 98.5 F (36.9 C) 97.9 F (36.6 C) 97.9 F (36.6 C)  TempSrc: Oral Oral Oral  SpO2: 97% 95% 98%  Weight: 111.1 kg    Height: 6\' 1"  (1.854 m)     Eyes: PERRL, lids and conjunctivae normal ENMT: Mucous membranes are moist. Posterior pharynx clear of any exudate or lesions.Normal dentition.  Neck: normal, supple, no masses, no thyromegaly Respiratory: clear to auscultation bilaterally, no wheezing, no  crackles. Normal respiratory effort. No accessory muscle use.  Cardiovascular: Regular rate and rhythm, no murmurs / rubs / gallops. No extremity edema. 2+ pedal pulses. No carotid bruits.  Abdomen: no tenderness, no masses palpated. No hepatosplenomegaly. Bowel sounds positive.  Musculoskeletal: no clubbing / cyanosis. No joint deformity upper and lower extremities. Good ROM, no contractures. Normal muscle tone.  Skin: no rashes, lesions, ulcers. No induration Neurologic: CN 2-12 grossly intact. Sensation intact, DTR normal. Strength 5/5 in all 4.  Psychiatric: Normal judgment and insight. Alert and oriented x 3. Normal mood.     Labs on Admission: I have personally reviewed following labs and imaging studies  CBC: Recent Labs  Lab 04/11/18 1747  WBC 6.3  HGB 11.4*  HCT 36.8*  MCV 62.7*  PLT 237   Basic Metabolic Panel: Recent Labs  Lab 04/11/18 1747  NA 137  K 4.0  CL 103  CO2 24  GLUCOSE 109*  BUN 13  CREATININE 1.01  CALCIUM 9.3   GFR: Estimated Creatinine Clearance: 86.4 mL/min (by C-G formula based on SCr of 1.01 mg/dL). Liver Function Tests: Recent Labs  Lab 04/11/18 1747  AST 28  ALT 26  ALKPHOS 46  BILITOT 0.8  PROT 7.1  ALBUMIN 3.7   No results for input(s): LIPASE, AMYLASE in the last 168 hours. No results for input(s): AMMONIA in the last 168 hours. Coagulation Profile: No results for input(s): INR, PROTIME in the last 168 hours. Cardiac Enzymes: No results for input(s): CKTOTAL, CKMB, CKMBINDEX, TROPONINI in the last 168 hours. BNP (last 3 results) No results for input(s): PROBNP in the last 8760 hours. HbA1C: No results for input(s): HGBA1C in the last 72 hours. CBG: No results for input(s): GLUCAP in the last 168 hours. Lipid Profile: No results for input(s): CHOL, HDL, LDLCALC, TRIG, CHOLHDL, LDLDIRECT in the last 72 hours. Thyroid Function Tests: No results for input(s): TSH, T4TOTAL, FREET4, T3FREE, THYROIDAB in the last 72  hours. Anemia Panel: No results for input(s): VITAMINB12, FOLATE, FERRITIN, TIBC, IRON, RETICCTPCT in the last 72 hours. Urine analysis: No results found for: COLORURINE, APPEARANCEUR, LABSPEC, PHURINE, GLUCOSEU, HGBUR, BILIRUBINUR, KETONESUR, PROTEINUR, UROBILINOGEN, NITRITE, LEUKOCYTESUR Sepsis Labs: @LABRCNTIP (procalcitonin:4,lacticidven:4) )No results found for this or any previous visit (from the past 240 hour(s)).   Radiological Exams on Admission: No results found.   Assessment/Plan Principal Problem:   Rectal bleed Active Problems:   HTN (hypertension)   History of prostate cancer   Symptomatic anemia     #1 rectal bleed: Most likely recurrent diverticular bleed.  Patient will be taken off the aspirin.  Admit to telemetry for 24 hours observation.  Monitor H&H every 6 hours.  If it drops we will transfuse.  Meanwhile type and crossmatch on hold.  IV  fluids.  IV Protonix.  May consult GI in the morning if bleeding persists.  Otherwise if hemoglobin remains stable and bleeding resolves most likely it is coming from his diverticular bleed.  #2 hypertension: Resume blood pressure medications once bleeding stops.  #3 symptomatic anemia: Hemoglobin is at 11 at the moment however patient is symptomatic.  It may require equilibration for true hemoglobin to March.  Follow closely and observe.   DVT prophylaxis: SCD Code Status: Full code Family Communication: No family available Disposition Plan: Home Consults called: Dr. Benson Norway to be called in the morning Admission status: Observation  Severity of Illness: The appropriate patient status for this patient is OBSERVATION. Observation status is judged to be reasonable and necessary in order to provide the required intensity of service to ensure the patient's safety. The patient's presenting symptoms, physical exam findings, and initial radiographic and laboratory data in the context of their medical condition is felt to place them at  decreased risk for further clinical deterioration. Furthermore, it is anticipated that the patient will be medically stable for discharge from the hospital within 2 midnights of admission. The following factors support the patient status of observation.   " The patient's presenting symptoms include rectal bleed. " The physical exam findings include no significant clinical findings not tender abdomen. " The initial radiographic and laboratory data are hemoglobin 11.4.     Barbette Merino MD [facility] Pager [phone]  If 7PM-7AM, please contact night-coverage [email or web address] Password [facility]  04/12/2018, 12:21 AM

## 2018-04-13 DIAGNOSIS — K625 Hemorrhage of anus and rectum: Secondary | ICD-10-CM | POA: Diagnosis not present

## 2018-04-13 LAB — HEMOGLOBIN AND HEMATOCRIT, BLOOD
HCT: 36.8 % — ABNORMAL LOW (ref 39.0–52.0)
Hemoglobin: 11.5 g/dL — ABNORMAL LOW (ref 13.0–17.0)

## 2018-04-13 LAB — CBC
HCT: 34.7 % — ABNORMAL LOW (ref 39.0–52.0)
Hemoglobin: 10.7 g/dL — ABNORMAL LOW (ref 13.0–17.0)
MCH: 19.4 pg — ABNORMAL LOW (ref 26.0–34.0)
MCHC: 30.8 g/dL (ref 30.0–36.0)
MCV: 63 fL — ABNORMAL LOW (ref 78.0–100.0)
Platelets: 250 10*3/uL (ref 150–400)
RBC: 5.51 MIL/uL (ref 4.22–5.81)
RDW: 16.5 % — ABNORMAL HIGH (ref 11.5–15.5)
WBC: 5.5 10*3/uL (ref 4.0–10.5)

## 2018-04-13 MED ORDER — FERROUS SULFATE 325 (65 FE) MG PO TABS: 325.0000 mg | ORAL_TABLET | Freq: Two times a day (BID) | ORAL | Status: DC

## 2018-04-13 MED ORDER — FERROUS SULFATE 325 (65 FE) MG PO TABS: 325.0000 mg | ORAL_TABLET | Freq: Two times a day (BID) | ORAL | 3 refills | Status: DC

## 2018-04-13 MED ORDER — AMLODIPINE BESYLATE 10 MG PO TABS
10.0000 mg | ORAL_TABLET | Freq: Every day | ORAL | Status: DC
  Administered 2018-04-13: 10 mg via ORAL
  Filled 2018-04-13: qty 1

## 2018-04-13 NOTE — Progress Notes (Signed)
Patient is alert and oriented, vital signs are stable, no signs of further bleeding since seen by MD Regalado this morning, discharge instructions reviewed with patient and wife, questions and concerns addressed Neta Mends RN 3:29 PM 04-13-2018

## 2018-04-13 NOTE — Discharge Instructions (Signed)
Please follow up with your PCP early next week. You need lab work to follow blood count and you need to have your blood pressure check.

## 2018-04-13 NOTE — Discharge Summary (Signed)
Physician Discharge Summary  MURVIN GIFT HBZ:[id number] DOB: [birth year] DOA: 04/11/2018  PCP: Foye Spurling, MD  Admit date: 04/11/2018 Discharge date: 04/13/2018  Admitted From: Home  Disposition: Home   Recommendations for Outpatient Follow-up:  1. Follow up with PCP in 2-3 days.  2. Please obtain BMP/CBC on follow up.  3. Monitor BP and consider resuming others BP medications.    Home Health: none  Discharge Condition: Stable.  CODE STATUS: full code.  Diet recommendation: Heart Healthy   Brief/Interim Summary: Brief Narrative: Gregory Glidden Fergusonis a 72 y.o.malewith medical history significant ofrecurrent rectal bleed secondary to diverticular disease who was last seen by Dr Kennon Portela August of this year with colonoscopy showing diverticular disease. He was transfused during that.. 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 He came to the ER with significant rectal bleed again today. Denied any nonsteroidal anti-inflammatory agents. Not on any anticoagulants. Patient has history of hypertension mainly and has been taking his medications. No pain. He takes aspirin 81 mg only. Patient also reported lightheadedness and dizziness. Hemoglobin appears stable however with bright red blood per rectum and previous history of GI bleed requiring transfusion patient is being admitted to the hospital for observation and work-up.  ED Course:Temperature is 98.5 blood pressure 172/96 was pulse 106 respiratory of 20 oxygen sat 90% room air. White count is 6.3 hemoglobin 11.4 platelets 261. Sodium 137 potassium 4.0 chloride 103 CO2 24 BUN 39 creatinine 1.01. Glucose is 109. Patient has grossly bloody stool with occult blood positive.  Assessment & Plan:   Principal Problem:   Rectal bleed Active Problems:   HTN (hypertension)   History of prostate cancer   Symptomatic  anemia   1-Rectal bleed, diverticular bleed.  No significant bleeding today. He report 2 drop of blood. denies abdominal pain. Will advanced his diet. Plan to discharge today. Advised to return to hospital if he develops worsening bleeding. Will provide iron supplement.  Discussed with Dr. Man, with GI. Recommend transfusion as need, no need for colonoscopy.  Hold aspirin.   HTN;  Holding  irbesartan.  BP increasing, will resume Norvasc.   Anemia; acute blood loss.  Hb stable at 11.     Discharge Diagnoses:  Principal Problem:   Rectal bleed Active Problems:   HTN (hypertension)   History of prostate cancer   Symptomatic anemia    Discharge Instructions  Discharge Instructions    Diet - low sodium heart healthy   Complete by:  As directed    Increase activity slowly   Complete by:  As directed      Allergies as of 04/13/2018   No Known Allergies     Medication List    STOP taking these medications   aspirin EC 81 MG tablet   irbesartan-hydrochlorothiazide 300-12.5 MG tablet Commonly known as:  AVALIDE     TAKE these medications   amLODipine 10 MG tablet Commonly known as:  NORVASC Take 10 mg by mouth daily.   ferrous sulfate 325 (65 FE) MG tablet Take 1 tablet (325 mg total) by mouth 2 (two) times daily with a meal.   hydroxypropyl methylcellulose / hypromellose 2.5 % ophthalmic solution Commonly known as:  ISOPTO TEARS / GONIOVISC Place 1 drop into both eyes 3 (three) times daily as needed for dry eyes.      Follow-up Information    Foye Spurling, MD Follow up in  3 day(s).   Specialty:  Internal Medicine Contact information: [address] [place] [address] [phone]          No Known Allergies  Consultations: Dr Man, over phone   Procedures/Studies:  No results found.   Subjective: Feeling well, one or two drop of blood with stool.    Discharge Exam: Vitals:   04/13/18 0511 04/13/18 1023  BP: (!)  160/85 (!) 157/97  Pulse: 85   Resp: 18   Temp: (!) 97.4 F (36.3 C)   SpO2: 95%    Vitals:   04/12/18 1211 04/12/18 2208 04/13/18 0511 04/13/18 1023  BP: (!) 148/82 (!) 151/91 (!) 160/85 (!) 157/97  Pulse: 86 87 85   Resp: 20 18 18    Temp: 98 F (36.7 C) 98.1 F (36.7 C) (!) 97.4 F (36.3 C)   TempSrc: Oral Oral Oral   SpO2: 94% 96% 95%   Weight:      Height:        General: Pt is alert, awake, not in acute distress Cardiovascular: RRR, S1/S2 +, no rubs, no gallops Respiratory: CTA bilaterally, no wheezing, no rhonchi Abdominal: Soft, NT, ND, bowel sounds + Extremities: no edema, no cyanosis    The results of significant diagnostics from this hospitalization (including imaging, microbiology, ancillary and laboratory) are listed below for reference.     Microbiology: No results found for this or any previous visit (from the past 240 hour(s)).   Labs: BNP (last 3 results) No results for input(s): BNP in the last 8760 hours. Basic Metabolic Panel: Recent Labs  Lab 04/11/18 1747 04/12/18 0241  NA 137 137  K 4.0 4.0  CL 103 104  CO2 24 25  GLUCOSE 109* 114*  BUN 13 12  CREATININE 1.01 0.96  CALCIUM 9.3 9.7   Liver Function Tests: Recent Labs  Lab 04/11/18 1747 04/12/18 0241  AST 28 26  ALT 26 27  ALKPHOS 46 49  BILITOT 0.8 0.9  PROT 7.1 7.4  ALBUMIN 3.7 3.9   No results for input(s): LIPASE, AMYLASE in the last 168 hours. No results for input(s): AMMONIA in the last 168 hours. CBC: Recent Labs  Lab 04/11/18 1747 04/12/18 0241 04/12/18 0709 04/12/18 1256 04/13/18 0442 04/13/18 1237  WBC 6.3 7.3 5.6 5.7 5.5  --   HGB 11.4* 11.8* 10.8* 11.1* 10.7* 11.5*  HCT 36.8* 37.6* 34.7* 35.8* 34.7* 36.8*  MCV 62.7* 62.4* 63.0* 62.9* 63.0*  --   PLT 261 271 241 246 250  --    Cardiac Enzymes: No results for input(s): CKTOTAL, CKMB, CKMBINDEX, TROPONINI in the last 168 hours. BNP: Invalid input(s): POCBNP CBG: No results for input(s): GLUCAP in the  last 168 hours. D-Dimer No results for input(s): DDIMER in the last 72 hours. Hgb A1c No results for input(s): HGBA1C in the last 72 hours. Lipid Profile No results for input(s): CHOL, HDL, LDLCALC, TRIG, CHOLHDL, LDLDIRECT in the last 72 hours. Thyroid function studies No results for input(s): TSH, T4TOTAL, T3FREE, THYROIDAB in the last 72 hours.  Invalid input(s): FREET3 Anemia work up No results for input(s): VITAMINB12, FOLATE, FERRITIN, TIBC, IRON, RETICCTPCT in the last 72 hours. Urinalysis No results found for: COLORURINE, APPEARANCEUR, LABSPEC, [place], GLUCOSEU, HGBUR, BILIRUBINUR, KETONESUR, PROTEINUR, UROBILINOGEN, NITRITE, LEUKOCYTESUR Sepsis Labs Invalid input(s): PROCALCITONIN,  WBC,  LACTICIDVEN Microbiology No results found for this or any previous visit (from the past 240 hour(s)).   Time coordinating discharge: 35 minutes.   SIGNED:   Cassie Freer  Macrae Wiegman, MD  [facility] 04/13/2018, 2:11 PM Pager [phone]  If 7PM-7AM, please contact night-coverage [email or web address] Password [email or web address]

## 2018-04-13 NOTE — Progress Notes (Signed)
Patient resting comfortably during shift report. Denies complaints.  

## 2018-04-15 ENCOUNTER — Observation Stay (HOSPITAL_COMMUNITY)
Admission: EM | Admit: 2018-04-15 | Discharge: 2018-04-16 | Disposition: A | Discharge status: Home / Self Care | Payer: Medicare Other | Attending: Internal Medicine | Admitting: Internal Medicine

## 2018-04-15 ENCOUNTER — Encounter (HOSPITAL_COMMUNITY): Payer: Self-pay | Admitting: Emergency Medicine

## 2018-04-15 ENCOUNTER — Other Ambulatory Visit: Payer: Self-pay

## 2018-04-15 DIAGNOSIS — Z8546 Personal history of malignant neoplasm of prostate: Secondary | ICD-10-CM

## 2018-04-15 DIAGNOSIS — K922 Gastrointestinal hemorrhage, unspecified: Secondary | ICD-10-CM | POA: Diagnosis not present

## 2018-04-15 DIAGNOSIS — K921 Melena: Secondary | ICD-10-CM | POA: Diagnosis not present

## 2018-04-15 DIAGNOSIS — Z8551 Personal history of malignant neoplasm of bladder: Secondary | ICD-10-CM | POA: Diagnosis not present

## 2018-04-15 DIAGNOSIS — Z79899 Other long term (current) drug therapy: Secondary | ICD-10-CM | POA: Insufficient documentation

## 2018-04-15 DIAGNOSIS — K625 Hemorrhage of anus and rectum: Secondary | ICD-10-CM

## 2018-04-15 DIAGNOSIS — D509 Iron deficiency anemia, unspecified: Secondary | ICD-10-CM | POA: Diagnosis not present

## 2018-04-15 DIAGNOSIS — Z7982 Long term (current) use of aspirin: Secondary | ICD-10-CM | POA: Diagnosis not present

## 2018-04-15 DIAGNOSIS — I10 Essential (primary) hypertension: Secondary | ICD-10-CM | POA: Diagnosis present

## 2018-04-15 DIAGNOSIS — K862 Cyst of pancreas: Secondary | ICD-10-CM | POA: Diagnosis not present

## 2018-04-15 DIAGNOSIS — K5730 Diverticulosis of large intestine without perforation or abscess without bleeding: Secondary | ICD-10-CM | POA: Diagnosis not present

## 2018-04-15 DIAGNOSIS — K869 Disease of pancreas, unspecified: Secondary | ICD-10-CM | POA: Diagnosis present

## 2018-04-15 HISTORY — DX: Diverticulosis of intestine, part unspecified, without perforation or abscess without bleeding: K57.90

## 2018-04-15 LAB — COMPREHENSIVE METABOLIC PANEL
ALT: 22 U/L (ref 0–44)
AST: 27 U/L (ref 15–41)
Albumin: 3.5 g/dL (ref 3.5–5.0)
Alkaline Phosphatase: 43 U/L (ref 38–126)
Anion gap: 6 (ref 5–15)
BUN: 11 mg/dL (ref 8–23)
CO2: 23 mmol/L (ref 22–32)
Calcium: 8.8 mg/dL — ABNORMAL LOW (ref 8.9–10.3)
Chloride: 107 mmol/L (ref 98–111)
Creatinine, Ser: 1.03 mg/dL (ref 0.61–1.24)
GFR calc Af Amer: >60 mL/min (ref >60)
GFR calc non Af Amer: >60 mL/min (ref >60)
Glucose, Bld: 99 mg/dL (ref 70–99)
Potassium: 3.7 mmol/L (ref 3.5–5.1)
Sodium: 136 mmol/L (ref 135–145)
Total Bilirubin: 0.7 mg/dL (ref 0.3–1.2)
Total Protein: 6.5 g/dL (ref 6.5–8.1)

## 2018-04-15 LAB — TYPE AND SCREEN
ABO/RH(D): O POS
ABO/RH(D): O POS
Antibody Screen: NEGATIVE
Antibody Screen: NEGATIVE

## 2018-04-15 LAB — CBC
HCT: 35 % — ABNORMAL LOW (ref 39.0–52.0)
HCT: 36.3 % — ABNORMAL LOW (ref 39.0–52.0)
Hemoglobin: 11 g/dL — ABNORMAL LOW (ref 13.0–17.0)
Hemoglobin: 11.3 g/dL — ABNORMAL LOW (ref 13.0–17.0)
MCH: 19.7 pg — ABNORMAL LOW (ref 26.0–34.0)
MCH: 19.6 pg — ABNORMAL LOW (ref 26.0–34.0)
MCHC: 31.4 g/dL (ref 30.0–36.0)
MCHC: 31.1 g/dL (ref 30.0–36.0)
MCV: 62.7 fL — ABNORMAL LOW (ref 78.0–100.0)
MCV: 62.8 fL — ABNORMAL LOW (ref 78.0–100.0)
Platelets: 237 10*3/uL (ref 150–400)
Platelets: 238 10*3/uL (ref 150–400)
RBC: 5.58 MIL/uL (ref 4.22–5.81)
RBC: 5.78 MIL/uL (ref 4.22–5.81)
RDW: 16 % — ABNORMAL HIGH (ref 11.5–15.5)
RDW: 17.7 % — ABNORMAL HIGH (ref 11.5–15.5)
WBC: 5.9 10*3/uL (ref 4.0–10.5)
WBC: 6.2 10*3/uL (ref 4.0–10.5)

## 2018-04-15 LAB — APTT: aPTT: 28 seconds (ref 24–36)

## 2018-04-15 LAB — PROTIME-INR
INR: 1.1
Prothrombin Time: 14.1 seconds (ref 11.4–15.2)

## 2018-04-15 MED ORDER — LACTATED RINGERS IV SOLN
INTRAVENOUS | Status: DC
  Administered 2018-04-15 (×2): via INTRAVENOUS

## 2018-04-15 MED ORDER — ZOLPIDEM TARTRATE 5 MG PO TABS
5.0000 mg | ORAL_TABLET | Freq: Once | ORAL | Status: AC
  Administered 2018-04-15: 5 mg via ORAL
  Filled 2018-04-15: qty 1

## 2018-04-15 MED ORDER — ONDANSETRON HCL 4 MG PO TABS: 4.0000 mg | ORAL_TABLET | Freq: Four times a day (QID) | ORAL | Status: DC | PRN

## 2018-04-15 MED ORDER — HYPROMELLOSE (GONIOSCOPIC) 2.5 % OP SOLN
1.0000 [drp] | Freq: Three times a day (TID) | OPHTHALMIC | Status: DC | PRN
  Filled 2018-04-15: qty 15

## 2018-04-15 MED ORDER — ACETAMINOPHEN 650 MG RE SUPP: 650.0000 mg | Freq: Four times a day (QID) | RECTAL | Status: DC | PRN

## 2018-04-15 MED ORDER — ONDANSETRON HCL 4 MG/2ML IJ SOLN: 4.0000 mg | Freq: Four times a day (QID) | INTRAMUSCULAR | Status: DC | PRN

## 2018-04-15 MED ORDER — ACETAMINOPHEN 325 MG PO TABS: 650.0000 mg | ORAL_TABLET | Freq: Four times a day (QID) | ORAL | Status: DC | PRN

## 2018-04-15 MED ORDER — FERROUS SULFATE 325 (65 FE) MG PO TABS
325.0000 mg | ORAL_TABLET | Freq: Two times a day (BID) | ORAL | Status: DC
  Filled 2018-04-15 (×2): qty 1

## 2018-04-15 MED ORDER — AMLODIPINE BESYLATE 10 MG PO TABS
10.0000 mg | ORAL_TABLET | Freq: Every day | ORAL | Status: DC
  Administered 2018-04-15 – 2018-04-16 (×2): 10 mg via ORAL
  Filled 2018-04-15 (×2): qty 1

## 2018-04-15 NOTE — ED Provider Notes (Signed)
[facility] Emergency Department Provider Note MRN:  [id number]  Arrival date & time: 04/15/18     Chief Complaint   Rectal Bleeding   History of Present Illness   Gregory Odonnell is a 72 y.o. year-old male with a history of diverticulosis presenting to the ED with chief complaint of rectal bleeding.  Patient explains that he was admitted to the hospital few days ago for rectal bleeding, was told he had diverticulosis.  Was admitted for 2 to 3 days and discharged.  Yesterday not have any rectal bleeding, but he has had return of rectal bleeding this morning.  The bleeding is painless, no pain with defecation.  No abdominal pain, no chest pain or shortness of breath.  No recent fevers.  Recently stopped taking his daily aspirin.  Was told to return the emergency department with any return of bleeding.  Bleeding has been constant this morning, blood seems to be incorporated within the stool.  Review of Systems  A complete 10 system review of systems was obtained and all systems are negative except as noted in the HPI and PMH.   Patient's Health History    Past Medical History:  Diagnosis Date  . Arthritis    "maybe in my shoulders and one of my fingers on right hands" (11/24/2015)  . Diverticulosis   . Hypertension   . Pancreatic lesion    routinely followed by [facility]  . Prostate cancer [facility])    prostate w/ ?mets to bladder  . Seasonal asthma    "sometimes in May"    Past Surgical History:  Procedure Laterality Date  . BACK SURGERY    . BLADDER SURGERY    . [place]   "herniated disc"  . PROSTATECTOMY  2011  . TONSILLECTOMY  1950s    Family History  Problem Relation Age of Onset  . Cancer Father        prostate  . Cancer Brother        lung    Social History   Socioeconomic History  . Marital status: Married    Spouse name: Not on file  . Number of children: Not on file  . Years of education: Not on file  . Highest  education level: Not on file  Occupational History  . Occupation: retired  Scientific laboratory technician  . Financial resource strain: Not on file  . Food insecurity:    Worry: Not on file    Inability: Not on file  . Transportation needs:    Medical: Not on file    Non-medical: Not on file  Tobacco Use  . Smoking status: Never Smoker  . Smokeless tobacco: Never Used  Substance and Sexual Activity  . Alcohol use: Yes    Comment: 11/24/2015 "I drink once q 6-7 months"  . Drug use: Not Currently    Types: Marijuana    Comment: "back in the 1970s only"  . Sexual activity: Not Currently  Lifestyle  . Physical activity:    Days per week: Not on file    Minutes per session: Not on file  . Stress: Not on file  Relationships  . Social connections:    Talks on phone: Not on file    Gets together: Not on file    Attends religious service: Not on file    Active member of club or organization: Not on file    Attends meetings of clubs or organizations: Not on file    Relationship  status: Not on file  . Intimate partner violence:    Fear of current or ex partner: Not on file    Emotionally abused: Not on file    Physically abused: Not on file    Forced sexual activity: Not on file  Other Topics Concern  . Not on file  Social History Narrative  . Not on file     Physical Exam  Vital Signs and Nursing Notes reviewed Vitals:   04/15/18 1025 04/15/18 1028  BP: (!) 151/86   Pulse: 89   Resp: 20 20  Temp: 98.9 F (37.2 C)   SpO2:  93%    CONSTITUTIONAL: Well-appearing, NAD NEURO:  Alert and oriented x 3, no focal deficits EYES:  eyes equal and reactive ENT/NECK:  no LAD, no JVD CARDIO: Tachycardic rate, well-perfused, normal S1 and S2 PULM:  CTAB no wheezing or rhonchi GI/GU:  normal bowel sounds, non-distended, non-tender MSK/SPINE:  No gross deformities, no edema SKIN:  no rash, atraumatic PSYCH:  Appropriate speech and behavior  Diagnostic and Interventional Summary    EKG  Interpretation  Date/Time:    Ventricular Rate:    PR Interval:    QRS Duration:   QT Interval:    QTC Calculation:   R Axis:     Text Interpretation:        Labs Reviewed  CBC - Abnormal; Notable for the following components:      Result Value   Hemoglobin 11.0 (*)    HCT 35.0 (*)    MCV 62.7 (*)    MCH 19.7 (*)    RDW 16.0 (*)    All other components within normal limits  COMPREHENSIVE METABOLIC PANEL - Abnormal; Notable for the following components:   Calcium 8.8 (*)    All other components within normal limits  PROTIME-INR  APTT  CBC  TYPE AND SCREEN    No orders to display    Medications  amLODipine (NORVASC) tablet 10 mg (10 mg Oral Given 04/15/18 1047)  ferrous sulfate tablet 325 mg (has no administration in time range)  hydroxypropyl methylcellulose / hypromellose (ISOPTO TEARS / GONIOVISC) 2.5 % ophthalmic solution 1 drop (has no administration in time range)  acetaminophen (TYLENOL) tablet 650 mg (has no administration in time range)    Or  acetaminophen (TYLENOL) suppository 650 mg (has no administration in time range)  ondansetron (ZOFRAN) tablet 4 mg (has no administration in time range)    Or  ondansetron (ZOFRAN) injection 4 mg (has no administration in time range)  lactated ringers infusion ( Intravenous New Bag/Given 04/15/18 1050)     Procedures Critical Care  ED Course and Medical Decision Making  I have reviewed the triage vital signs and the nursing notes.  Pertinent labs & imaging results that were available during my care of the patient were reviewed by me and considered in my medical decision making (see below for details).  Concern for recurrence of diverticulosis bleeding in this 72 year old male with history of the same.  Will obtain labs and consider readmission.  Hemoglobin slightly downtrending.  Admitted to hospital service for further care.  Barth Kirks. [place] Small, [place] [email or web address]  Final Clinical Impressions(s) / ED Diagnoses     ICD-10-CM   1. Rectal bleeding K62.5     ED Discharge Orders    None         Maudie Flakes, MD 04/15/18 1523

## 2018-04-15 NOTE — Consult Note (Signed)
Reason for Consult: Hematochezia Referring Physician: [facility]  Gregory Odonnell HPI: This is a 72 year old male with a PMH a diverticular bleed in 2017, pan-diverticulosis, s/p recent colonoscopy on 02/13/2018, pancreatic cyst, and prostate cancer who is readmitted with complaints of hematochezia.  Last Friday he reported having painless bloody stools.  He was admitted and he was monitored over the weekend without any significant change with  His HGB.  At the time of D/C he still had some minor bleeding, which resolved when he was at home.  Yesterday he started to have mild bleeding and this concerned him, which prompted him to come back to the hospital.  He has a low threshold for seeking care as he had a significant diverticular bleed in 2017.  The patient denies any chest pain or SOB.  Overall he feels well and his HGB is stable at 11 g/dL, which is his baseline.  There are no complaints of abdominal pain.  Past Medical History:  Diagnosis Date  . Arthritis    "maybe in my shoulders and one of my fingers on right hands" (11/24/2015)  . Diverticulosis   . Hypertension   . Pancreatic lesion    routinely [facility] by [facility]  . Prostate cancer [facility])    prostate w/ ?mets to bladder  . Seasonal asthma    "sometimes in May"    Past Surgical History:  Procedure Laterality Date  . BACK SURGERY    . BLADDER SURGERY    . [place]   "herniated disc"  . PROSTATECTOMY  2011  . TONSILLECTOMY  1950s    Family History  Problem Relation Age of Onset  . Cancer Father        prostate  . Cancer Brother        lung    Social History:  reports that he has never smoked. He has never used smokeless tobacco. He reports that he drinks alcohol. He reports that he has current or past drug history. Drug: Marijuana.  Allergies: No Known Allergies  Medications:  Scheduled: . amLODipine  10 mg Oral Daily  . ferrous sulfate  325 mg Oral BID WC   Continuous: .  lactated ringers 75 mL/hr at 04/15/18 1050    Results for orders placed or performed during the hospital encounter of 04/15/18 (from the past 24 hour(s))  Type and screen [place]     Status: None   Collection Time: 04/15/18  8:06 AM  Result Value Ref Range   ABO/RH(D) O POS    Antibody Screen NEG    Sample Expiration      04/18/2018 Performed at [facility], [place] [address] [place], [place] [address]   CBC     Status: Abnormal   Collection Time: 04/15/18  8:07 AM  Result Value Ref Range   WBC 5.9 4.0 - 10.5 K/uL   RBC 5.58 4.22 - 5.81 MIL/uL   Hemoglobin 11.0 (L) 13.0 - 17.0 g/dL   HCT 35.0 (L) 39.0 - 52.0 %   MCV 62.7 (L) 78.0 - 100.0 fL   MCH 19.7 (L) 26.0 - 34.0 pg   MCHC 31.4 30.0 - 36.0 g/dL   RDW 16.0 (H) 11.5 - 15.5 %   Platelets 237 150 - 400 K/uL  Comprehensive metabolic panel     Status: Abnormal   Collection Time: 04/15/18  8:07 AM  Result Value Ref Range   Sodium 136 135 - 145 mmol/L  Potassium 3.7 3.5 - 5.1 mmol/L   Chloride 107 98 - 111 mmol/L   CO2 23 22 - 32 mmol/L   Glucose, Bld 99 70 - 99 mg/dL   BUN 11 8 - 23 mg/dL   Creatinine, Ser 1.03 0.61 - 1.24 mg/dL   Calcium 8.8 (L) 8.9 - 10.3 mg/dL   Total Protein 6.5 6.5 - 8.1 g/dL   Albumin 3.5 3.5 - 5.0 g/dL   AST 27 15 - 41 U/L   ALT 22 0 - 44 U/L   Alkaline Phosphatase 43 38 - 126 U/L   Total Bilirubin 0.7 0.3 - 1.2 mg/dL   GFR calc non Af Amer >60 >60 mL/min   GFR calc Af Amer >60 >60 mL/min   Anion gap 6 5 - 15  Protime-INR     Status: None   Collection Time: 04/15/18  8:07 AM  Result Value Ref Range   Prothrombin Time 14.1 11.4 - 15.2 seconds   INR 1.10   APTT     Status: None   Collection Time: 04/15/18  8:07 AM  Result Value Ref Range   aPTT 28 24 - 36 seconds     No results found.  ROS:  As stated above in the HPI otherwise negative.  Blood pressure (!) 151/86, pulse 89, temperature 98.9 F (37.2 C), temperature source Oral, resp. rate 20, height 6\' 1"   (1.854 m), weight 112 kg, SpO2 93 %.    PE: Gen: NAD, Alert and Oriented HEENT:  [place]/AT, EOMI Neck: Supple, no LAD Lungs: CTA Bilaterally CV: RRR without M/G/R ABM: Soft, NTND, +BS Ext: No C/C/E  Assessment/Plan: 1) Hematochezia - probable hemorrhoidal during this admission. 2) Pan diverticulosis. 3) History of a diverticular bleed.   The patient is stable.  His bleeding is minimal and his HGB is stable.  Even though his bleeding appeared to be worse this past Friday, his HGB has not changed.  This is most likely hemorrhoidal in origin.  He is hemodynamically stable.  Plan: 1) Follow HGB. 2) He will follow up in the office in one week.  He was encouraged to call me after discharge if he has any further bleeding to discern if he requires hospitalization.  Gregory Odonnell D 04/15/2018, 1:37 PM

## 2018-04-15 NOTE — H&P (Signed)
History and Physical    Gregory Odonnell [id number] DOB: 03-12-1946 DOA: 04/15/2018  PCP: [name], MD Consultants:  Brennan Bailey - urology Patient coming from:  Home - lives with wife; NOK: wife, [phone]  Chief Complaint: rectal bleeding  HPI: Gregory Odonnell is a 72 y.o. 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  On Friday morning, he noticed some bright red blood in his stool.  He came in that day and was admitted 9/27-29.  He still saw some blood on 9/29.  Yesterday, it seemed to be normal - stool was brown with no blood.  This AM, he saw bleeding again.  This is only small amounts of blood in the stool and in the commode.  The first time he ever had this was in 2017.  He last colonoscopy was 6 weeks ago and they saw diverticulosis but otherwise normal.  Not light-headed, dizzy.  No SOB with walking.  They suggested on hospital d/c that he take iron and he is not eating much so hasn't started his iron yet.     ED Course:  H/o transfusions for GI bleeding with recent admission for the same.  D/c 9/29, recurrent bleeding overnight and today.  Bleeding enclosed within the stool.  Painless rectal bleeding, hemodynamically stable.    Review of Systems: As per HPI; otherwise review of systems reviewed and negative.   Ambulatory Status:  Ambulates without assistance  Past Medical History:  Diagnosis Date  . Arthritis    "maybe in my shoulders and one of my fingers on right hands" (11/24/2015)  . Diverticulosis   . Hypertension   . Pancreatic lesion    routinely followed by [facility]  . Prostate cancer [facility])    prostate w/ ?mets to bladder  . Seasonal asthma    "sometimes in May"    Past Surgical History:  Procedure Laterality Date  . BACK SURGERY      . BLADDER SURGERY    . [place]   "herniated disc"  . PROSTATECTOMY  2011  . TONSILLECTOMY  1950s    Social History   Socioeconomic History  . Marital status: Married    Spouse name: Not on file  . Number of children: Not on file  . Years of education: Not on file  . Highest education level: Not on file  Occupational History  . Occupation: retired  Scientific laboratory technician  . Financial resource strain: Not on file  . Food insecurity:    Worry: Not on file    Inability: Not on file  . Transportation needs:    Medical: Not on file    Non-medical: Not on file  Tobacco Use  . Smoking status: Never Smoker  . Smokeless tobacco: Never Used  Substance and Sexual Activity  . Alcohol use: Yes    Comment: 11/24/2015 "I drink once q 6-7 months"  . Drug use: Not Currently    Types: Marijuana    Comment: "back in the 1970s only"  . Sexual activity: Not Currently  Lifestyle  . Physical activity:    Days per week: Not on file    Minutes per session: Not on file  . Stress: Not on file  Relationships  . Social connections:    Talks on phone: Not on file    Gets together: Not on  file    Attends religious service: Not on file    Active member of club or organization: Not on file    Attends meetings of clubs or organizations: Not on file    Relationship status: Not on file  . Intimate partner violence:    Fear of current or ex partner: Not on file    Emotionally abused: Not on file    Physically abused: Not on file    Forced sexual activity: Not on file  Other Topics Concern  . Not on file  Social History Narrative  . Not on file    No Known Allergies  Family History  Problem Relation Age of Onset  . Cancer Father        prostate  . Cancer Brother        lung    Prior to Admission medications   Medication Sig Start Date End Date Taking? Authorizing Provider  amLODipine (NORVASC) 10 MG tablet Take 10 mg by mouth daily. 02/22/14   [provider]   ferrous sulfate 325 (65 FE) MG tablet Take 1 tablet (325 mg total) by mouth 2 (two) times daily with a meal. 04/13/18   Regalado, Belkys A, MD  hydroxypropyl methylcellulose / hypromellose (ISOPTO TEARS / GONIOVISC) 2.5 % ophthalmic solution Place 1 drop into both eyes 3 (three) times daily as needed for dry eyes.    [provider]    Physical Exam: Vitals:   04/15/18 0815 04/15/18 0830 04/15/18 0845 04/15/18 0915  BP: 134/83 134/76 129/77 126/75  Pulse: 85 87 83 89  Resp:   16 16  Temp:      TempSrc:      SpO2: 99% 98% 99% 98%     General:  Appears calm and comfortable and is NAD Eyes:  PERRL, EOMI, normal lids, iris ENT:  grossly normal hearing, lips & tongue, mmm; appropriate dentition Neck:  no LAD, masses or thyromegaly; no carotid bruits Cardiovascular:  RRR, no m/r/g. No LE edema.  Respiratory:   CTA bilaterally with no wheezes/rales/rhonchi.  Normal respiratory effort. Abdomen:  soft, NT, ND, NABS Back:   normal alignment, no CVAT Skin:  no rash or induration seen on limited exam Musculoskeletal:  grossly normal tone BUE/BLE, good ROM, no bony abnormality Lower extremity:  No LE edema.    2+ distal pulses. Psychiatric:  grossly normal mood and affect, speech fluent and appropriate, AOx3 Neurologic:  CN 2-12 grossly intact, moves all extremities in coordinated fashion, sensation intact    Radiological Exams on Admission: No results found.  EKG: not done   Labs on Admission: I have personally reviewed the available labs and imaging studies at the time of the admission.  Pertinent labs:   CMP essentially normal WBC 5.9 Hgb 11.0 - stable INR 1.10   Assessment/Plan Principal Problem:   Lower GI bleeding Active Problems:   HTN (hypertension)   History of prostate cancer   Pancreatic lesion   Lower GI Bleeding -Patient with presumed recurrent diverticular bleeding -He had a colonoscopy in 8/19 with a polyp that was removed and diverticulosis  noted -While there is little likely to be done, since he is returning to the hospital it seems prudent to consult GI -I have spoken with Dr. Benson Norway and he will see the patient -Overnight Observation  -CBC again this PM and tomorrow AM; transfuse for Hgb <7.  Currently Hgb appears to be stable and he does not report symptoms associated with anemia at this time. 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 Benson Norway  Admission status: It is my clinical opinion that referral for OBSERVATION is reasonable and necessary in this patient based on the above information provided. The aforementioned taken together are felt to place the patient at high risk for further clinical deterioration. However it is anticipated that the patient may be medically stable for discharge from the hospital within 24 to 48 hours.    Karmen Bongo MD [facility]  If note is complete, please contact covering daytime or nighttime physician. [email or web address] Password [email or web address]  04/15/2018, 10:00 AM

## 2018-04-15 NOTE — ED Triage Notes (Signed)
Patient states he was discharged from the hospital on Sunday and told to return if he had reoccurrences of his rectal bleeding. Pt has appt with PCP tmw. Reports 2 episodes since left hospital.

## 2018-04-16 ENCOUNTER — Encounter (HOSPITAL_COMMUNITY): Payer: Self-pay | Admitting: Internal Medicine

## 2018-04-16 DIAGNOSIS — K921 Melena: Secondary | ICD-10-CM | POA: Diagnosis not present

## 2018-04-16 DIAGNOSIS — K922 Gastrointestinal hemorrhage, unspecified: Secondary | ICD-10-CM

## 2018-04-16 LAB — BASIC METABOLIC PANEL
Anion gap: 8 (ref 5–15)
BUN: 8 mg/dL (ref 8–23)
CO2: 25 mmol/L (ref 22–32)
Calcium: 9.1 mg/dL (ref 8.9–10.3)
Chloride: 104 mmol/L (ref 98–111)
Creatinine, Ser: 0.93 mg/dL (ref 0.61–1.24)
GFR calc Af Amer: >60 mL/min (ref >60)
GFR calc non Af Amer: >60 mL/min (ref >60)
Glucose, Bld: 103 mg/dL — ABNORMAL HIGH (ref 70–99)
Potassium: 3.7 mmol/L (ref 3.5–5.1)
Sodium: 137 mmol/L (ref 135–145)

## 2018-04-16 LAB — CBC
HCT: 36.5 % — ABNORMAL LOW (ref 39.0–52.0)
Hemoglobin: 11.1 g/dL — ABNORMAL LOW (ref 13.0–17.0)
MCH: 19.4 pg — ABNORMAL LOW (ref 26.0–34.0)
MCHC: 30.4 g/dL (ref 30.0–36.0)
MCV: 63.9 fL — ABNORMAL LOW (ref 78.0–100.0)
Platelets: 229 10*3/uL (ref 150–400)
RBC: 5.71 MIL/uL (ref 4.22–5.81)
RDW: 17.4 % — ABNORMAL HIGH (ref 11.5–15.5)
WBC: 6.2 10*3/uL (ref 4.0–10.5)

## 2018-04-16 MED ORDER — FERROUS SULFATE 325 (65 FE) MG PO TABS: 325.0000 mg | ORAL_TABLET | Freq: Two times a day (BID) | ORAL | 3 refills | Status: DC

## 2018-04-16 NOTE — Discharge Summary (Addendum)
Physician Discharge Summary  Gregory Odonnell [id number] DOB: [birth year] DOA: 04/15/2018  PCP: [name], MD  Admit date: 04/15/2018 Discharge date: 04/16/2018  Time spent: 45 minutes  Recommendations for Outpatient Follow-up:  1. Follow up with Dr Benson Norway 1-2 weeks 2. Repeat CT of pancreas 04/2019   Discharge Diagnoses:  Principal Problem:   Lower GI bleeding Active Problems:   HTN (hypertension)   History of prostate cancer   Pancreatic lesion   Discharge Condition: stable at baseline  Diet recommendation: heart healthy  Filed Weights   04/15/18 1028  Weight: 112 kg    History of present illness:  This is a 72 year old male with a PMH a diverticular bleed in 2017, pan-diverticulosis, s/p recent colonoscopy on 02/13/2018, pancreatic cyst, and prostate cancer who was readmitted 04/15/18 with complaints of hematochezia.  4 days prior he reported having painless bloody stools.  He was admitted and he was monitored over the weekend without any significant change with  His HGB.  At the time of D/C he still had some minor bleeding, which resolved when he was at home.  one day prior to this admission he started to have mild bleeding and this concerned him, which prompted him to come back to the hospital.  GI evaluated and noted " a low threshold for seeking care as he had a significant diverticular bleed in 2017".  The patient denied any chest pain or SOB.  Overall he felt well and his HGB was stable at 11 g/dL, which is his baseline.  There were no complaints of abdominal pain.  Hospital Course:  Lower GI Bleeding -Patient with presumed recurrent diverticular bleeding -He had a colonoscopy in 8/19 with a polyp that was removed and diverticulosis noted -no further bloody stools and Hg stable at baseline -evaluated by gi who opine may discharge if Hg stable with follow up   HTN -fair control  H/o prostate CA -s/p robotic prostatectomy in 9/12 -Low recurrence risk, stage T1-2a  and Gleason <7, PSA <10  H/o bladder CA -TaG1 TCC s/p TURBT remotely (prior to prostate CA) without recurrence on cysto -Cystoscopy last performed in 10/18, with note that patient would be scheduled for TURBT and RPG in 3 months  Pancreatic lesion -As of 10/18, this was thought to be a stable cyst -Plan was for repeat CT pancreas in 04/2019   Procedures:  none  Consultations:  Dr hung gastroenterology  Discharge Exam: Vitals:   04/15/18 1028 04/16/18 0603  BP:  (!) 158/68  Pulse:  93  Resp: 20 18  Temp:  98.3 F (36.8 C)  SpO2: 93%     General: ambulating in room with steady gait Cardiovascular: rrr no mgr no LE edema Respiratory: normal effort BS clear bilaterally no wheeze Abdomen: soft non-distended no guarding or rebounding  Discharge Instructions   Discharge Instructions    Call MD for:  persistant dizziness or light-headedness   Complete by:  As directed    Diet - low sodium heart healthy   Complete by:  As directed    Discharge instructions   Complete by:  As directed    Follow up with Dr Benson Norway 1-2 weeks Take medications as directed   Increase activity slowly   Complete by:  As directed      Allergies as of 04/16/2018   No Known Allergies     Medication List    TAKE these medications   amLODipine 10 MG tablet Commonly known as:  NORVASC Take 10 mg  by mouth daily.   aspirin EC 81 MG tablet Take 81 mg by mouth daily.   DRY EYES OP Place 1 drop into both eyes as needed.   ferrous sulfate 325 (65 FE) MG tablet Take 1 tablet (325 mg total) by mouth 2 (two) times daily with a meal.   irbesartan-hydrochlorothiazide 300-12.5 MG tablet Commonly known as:  AVALIDE Take 1 tablet by mouth daily.      No Known Allergies    The results of significant diagnostics from this hospitalization (including imaging, microbiology, ancillary and laboratory) are listed below for reference.    Significant Diagnostic Studies: No results  found.  Microbiology: No results found for this or any previous visit (from the past 240 hour(s)).   Labs: Basic Metabolic Panel: Recent Labs  Lab 04/11/18 1747 04/12/18 0241 04/15/18 0807 04/16/18 0601  NA 137 137 136 137  K 4.0 4.0 3.7 3.7  CL 103 104 107 104  CO2 24 25 23 25   GLUCOSE 109* 114* 99 103*  BUN 13 12 11 8   CREATININE 1.01 0.96 1.03 0.93  CALCIUM 9.3 9.7 8.8* 9.1   Liver Function Tests: Recent Labs  Lab 04/11/18 1747 04/12/18 0241 04/15/18 0807  AST 28 26 27   ALT 26 27 22   ALKPHOS 46 49 43  BILITOT 0.8 0.9 0.7  PROT 7.1 7.4 6.5  ALBUMIN 3.7 3.9 3.5   No results for input(s): LIPASE, AMYLASE in the last 168 hours. No results for input(s): AMMONIA in the last 168 hours. CBC: Recent Labs  Lab 04/12/18 1256 04/13/18 0442 04/13/18 1237 04/15/18 0807 04/15/18 1545 04/16/18 0601  WBC 5.7 5.5  --  5.9 6.2 6.2  HGB 11.1* 10.7* 11.5* 11.0* 11.3* 11.1*  HCT 35.8* 34.7* 36.8* 35.0* 36.3* 36.5*  MCV 62.9* 63.0*  --  62.7* 62.8* 63.9*  PLT 246 250  --  237 238 229   Cardiac Enzymes: No results for input(s): CKTOTAL, CKMB, CKMBINDEX, TROPONINI in the last 168 hours. BNP: BNP (last 3 results) No results for input(s): BNP in the last 8760 hours.  ProBNP (last 3 results) No results for input(s): PROBNP in the last 8760 hours.  CBG: No results for input(s): GLUCAP in the last 168 hours.     Signed:  Radene Gunning MD.  [facility] 04/16/2018, 9:42 AM

## 2018-04-16 NOTE — Care Management Obs Status (Signed)
[place] NOTIFICATION   Patient Details  Name: KVON MCILHENNY MRN: [id number] Date of Birth: Nov 11, 1945   Medicare Observation Status Notification Given:  Yes    Midge Minium RN, BSN, NCM-BC, ACM-RN [phone] 04/16/2018, 9:41 AM

## 2018-04-21 DIAGNOSIS — R195 Other fecal abnormalities: Secondary | ICD-10-CM | POA: Diagnosis not present

## 2018-04-30 DIAGNOSIS — Z6834 Body mass index (BMI) 34.0-34.9, adult: Secondary | ICD-10-CM | POA: Diagnosis not present

## 2018-04-30 DIAGNOSIS — D649 Anemia, unspecified: Secondary | ICD-10-CM | POA: Diagnosis not present

## 2018-04-30 DIAGNOSIS — I10 Essential (primary) hypertension: Secondary | ICD-10-CM | POA: Diagnosis not present

## 2018-04-30 DIAGNOSIS — C61 Malignant neoplasm of prostate: Secondary | ICD-10-CM | POA: Diagnosis not present

## 2018-05-08 DIAGNOSIS — Z8551 Personal history of malignant neoplasm of bladder: Secondary | ICD-10-CM | POA: Diagnosis not present

## 2018-05-08 DIAGNOSIS — Z8546 Personal history of malignant neoplasm of prostate: Secondary | ICD-10-CM | POA: Diagnosis not present

## 2018-05-28 DIAGNOSIS — I10 Essential (primary) hypertension: Secondary | ICD-10-CM | POA: Diagnosis not present

## 2018-05-28 DIAGNOSIS — Z6835 Body mass index (BMI) 35.0-35.9, adult: Secondary | ICD-10-CM | POA: Diagnosis not present

## 2018-06-18 DIAGNOSIS — J45909 Unspecified asthma, uncomplicated: Secondary | ICD-10-CM | POA: Diagnosis not present

## 2018-06-18 DIAGNOSIS — J209 Acute bronchitis, unspecified: Secondary | ICD-10-CM | POA: Diagnosis not present

## 2018-07-02 DIAGNOSIS — I10 Essential (primary) hypertension: Secondary | ICD-10-CM | POA: Diagnosis not present

## 2018-07-02 DIAGNOSIS — Z6835 Body mass index (BMI) 35.0-35.9, adult: Secondary | ICD-10-CM | POA: Diagnosis not present

## 2018-08-13 DIAGNOSIS — I10 Essential (primary) hypertension: Secondary | ICD-10-CM | POA: Diagnosis not present

## 2018-08-18 DIAGNOSIS — M65312 Trigger thumb, left thumb: Secondary | ICD-10-CM | POA: Diagnosis not present

## 2018-09-19 DIAGNOSIS — M65312 Trigger thumb, left thumb: Secondary | ICD-10-CM | POA: Diagnosis not present

## 2018-12-12 DIAGNOSIS — D649 Anemia, unspecified: Secondary | ICD-10-CM | POA: Diagnosis not present

## 2018-12-12 DIAGNOSIS — Z833 Family history of diabetes mellitus: Secondary | ICD-10-CM | POA: Diagnosis not present

## 2018-12-12 DIAGNOSIS — D529 Folate deficiency anemia, unspecified: Secondary | ICD-10-CM | POA: Diagnosis not present

## 2018-12-12 DIAGNOSIS — I10 Essential (primary) hypertension: Secondary | ICD-10-CM | POA: Diagnosis not present

## 2018-12-12 DIAGNOSIS — Z6835 Body mass index (BMI) 35.0-35.9, adult: Secondary | ICD-10-CM | POA: Diagnosis not present

## 2018-12-12 DIAGNOSIS — D170 Benign lipomatous neoplasm of skin and subcutaneous tissue of head, face and neck: Secondary | ICD-10-CM | POA: Diagnosis not present

## 2018-12-26 DIAGNOSIS — M65312 Trigger thumb, left thumb: Secondary | ICD-10-CM | POA: Diagnosis not present

## 2019-02-04 DIAGNOSIS — J45909 Unspecified asthma, uncomplicated: Secondary | ICD-10-CM | POA: Diagnosis not present

## 2019-02-04 DIAGNOSIS — I10 Essential (primary) hypertension: Secondary | ICD-10-CM | POA: Diagnosis not present

## 2019-02-13 ENCOUNTER — Other Ambulatory Visit: Payer: Self-pay

## 2019-03-10 DIAGNOSIS — I10 Essential (primary) hypertension: Secondary | ICD-10-CM | POA: Diagnosis not present

## 2019-03-10 DIAGNOSIS — J45909 Unspecified asthma, uncomplicated: Secondary | ICD-10-CM | POA: Diagnosis not present

## 2019-03-10 DIAGNOSIS — D170 Benign lipomatous neoplasm of skin and subcutaneous tissue of head, face and neck: Secondary | ICD-10-CM | POA: Diagnosis not present

## 2019-03-11 DIAGNOSIS — Z23 Encounter for immunization: Secondary | ICD-10-CM | POA: Diagnosis not present

## 2019-04-21 DIAGNOSIS — J45909 Unspecified asthma, uncomplicated: Secondary | ICD-10-CM | POA: Diagnosis not present

## 2019-04-21 DIAGNOSIS — I10 Essential (primary) hypertension: Secondary | ICD-10-CM | POA: Diagnosis not present

## 2019-04-21 DIAGNOSIS — D170 Benign lipomatous neoplasm of skin and subcutaneous tissue of head, face and neck: Secondary | ICD-10-CM | POA: Diagnosis not present

## 2019-04-27 DIAGNOSIS — K862 Cyst of pancreas: Secondary | ICD-10-CM | POA: Diagnosis not present

## 2019-05-12 DIAGNOSIS — Z8551 Personal history of malignant neoplasm of bladder: Secondary | ICD-10-CM | POA: Diagnosis not present

## 2019-05-12 DIAGNOSIS — Z8546 Personal history of malignant neoplasm of prostate: Secondary | ICD-10-CM | POA: Diagnosis not present

## 2019-05-22 DIAGNOSIS — M65312 Trigger thumb, left thumb: Secondary | ICD-10-CM | POA: Diagnosis not present

## 2019-08-11 DIAGNOSIS — Z8551 Personal history of malignant neoplasm of bladder: Secondary | ICD-10-CM | POA: Diagnosis not present

## 2019-08-11 DIAGNOSIS — Z8546 Personal history of malignant neoplasm of prostate: Secondary | ICD-10-CM | POA: Diagnosis not present

## 2019-10-01 DIAGNOSIS — C61 Malignant neoplasm of prostate: Secondary | ICD-10-CM | POA: Diagnosis not present

## 2019-10-01 DIAGNOSIS — D649 Anemia, unspecified: Secondary | ICD-10-CM | POA: Diagnosis not present

## 2019-10-01 DIAGNOSIS — J45909 Unspecified asthma, uncomplicated: Secondary | ICD-10-CM | POA: Diagnosis not present

## 2019-10-01 DIAGNOSIS — I10 Essential (primary) hypertension: Secondary | ICD-10-CM | POA: Diagnosis not present

## 2019-11-24 DIAGNOSIS — H25812 Combined forms of age-related cataract, left eye: Secondary | ICD-10-CM | POA: Diagnosis not present

## 2019-11-24 DIAGNOSIS — H2511 Age-related nuclear cataract, right eye: Secondary | ICD-10-CM | POA: Diagnosis not present

## 2019-12-24 DIAGNOSIS — H5201 Hypermetropia, right eye: Secondary | ICD-10-CM | POA: Diagnosis not present

## 2019-12-24 DIAGNOSIS — H2522 Age-related cataract, morgagnian type, left eye: Secondary | ICD-10-CM | POA: Diagnosis not present

## 2020-02-01 DIAGNOSIS — D529 Folate deficiency anemia, unspecified: Secondary | ICD-10-CM | POA: Diagnosis not present

## 2020-02-01 DIAGNOSIS — C61 Malignant neoplasm of prostate: Secondary | ICD-10-CM | POA: Diagnosis not present

## 2020-02-01 DIAGNOSIS — Z833 Family history of diabetes mellitus: Secondary | ICD-10-CM | POA: Diagnosis not present

## 2020-02-01 DIAGNOSIS — I10 Essential (primary) hypertension: Secondary | ICD-10-CM | POA: Diagnosis not present

## 2020-02-01 DIAGNOSIS — R739 Hyperglycemia, unspecified: Secondary | ICD-10-CM | POA: Diagnosis not present

## 2020-02-01 DIAGNOSIS — D649 Anemia, unspecified: Secondary | ICD-10-CM | POA: Diagnosis not present

## 2020-03-16 DIAGNOSIS — H25812 Combined forms of age-related cataract, left eye: Secondary | ICD-10-CM | POA: Diagnosis not present

## 2020-03-16 DIAGNOSIS — H2512 Age-related nuclear cataract, left eye: Secondary | ICD-10-CM | POA: Diagnosis not present

## 2020-03-16 DIAGNOSIS — H2522 Age-related cataract, morgagnian type, left eye: Secondary | ICD-10-CM | POA: Diagnosis not present

## 2020-03-17 DIAGNOSIS — I480 Paroxysmal atrial fibrillation: Secondary | ICD-10-CM | POA: Diagnosis not present

## 2020-03-25 DIAGNOSIS — I480 Paroxysmal atrial fibrillation: Secondary | ICD-10-CM | POA: Diagnosis not present

## 2020-04-06 DIAGNOSIS — I480 Paroxysmal atrial fibrillation: Secondary | ICD-10-CM | POA: Insufficient documentation

## 2020-04-06 NOTE — Progress Notes (Signed)
Patient referred by [name], MD for atrial fibrillation  Subjective:   Gregory Odonnell, male    DOB: 09-15-1945, 74 y.o.   MRN: [id number]   Chief Complaint  Patient presents with   Atrial Fibrillation   Hypertension   New Patient (Initial Visit)     HPI  74 y.o. African-American male with hypertension, paroxysmal atrial fibrillation.  Patient is retired Therapist, music.  He has at longstanding hypertension, but denies any other medical comorbidities.  He was incidentally found to be in atrial fibrillation during his cataract surgery on September 1.  He since then followed up with his PCP, where he was found to be in sinus rhythm.  Patient denies any complaints of chest pain, shortness of breath.  That said, his physical activity is limited.  He used to snore in the past.  He has been tolerating Eliquis well.  Blood pressure is elevated today.  Reports home blood pressure to be around 130/70 mmHg.  Past Medical History:  Diagnosis Date   Arthritis    "maybe in my shoulders and one of my fingers on right hands" (11/24/2015)   Diverticulosis    Hypertension    Pancreatic lesion    routinely followed by [facility])    prostate w/ ?mets to bladder   Seasonal asthma    "sometimes in May"     Past Surgical History:  Procedure Laterality Date   [place]   "herniated disc"   PROSTATECTOMY  2011   TONSILLECTOMY  1950s     Social History   Tobacco Use  Smoking Status Never Smoker  Smokeless Tobacco Never Used    Social History   Substance and Sexual Activity  Alcohol Use Yes   Comment: 11/24/2015 "I drink once q 6-7 months"     Family History  Problem Relation Age of Onset   Cancer Father        prostate   Cancer Brother      Current Outpatient Medications on File Prior to Visit  Medication Sig Dispense Refill   amLODipine (NORVASC) 10 MG  tablet Take 10 mg by mouth daily.     Artificial Tear Ointment (DRY EYES OP) Place 1 drop into both eyes as needed.     BREO ELLIPTA 100-25 MCG/INH AEPB as needed.     irbesartan-hydrochlorothiazide (AVALIDE) 300-12.5 MG tablet Take 1 tablet by mouth daily.     nebivolol (BYSTOLIC) 10 MG tablet Take 1 tablet by mouth daily.     No current facility-administered medications on file prior to visit.    Cardiovascular and other pertinent studies:  EKG 04/07/2020: Sinus rhythm  Occasional PAC   Right bundle branch block Anterolateral T wave inversion, consider ischemia   EKG 03/16/2020:  Atrial Fibrillation Right Bundle Branch Block Lateral T wave changes are nonspecific   Recent labs: 02/01/2020: Glucose 111, BUN/Cr 15/1.09. EGFR 77. Na/K 139/4.1. Rest of the CMP normal H/H 12.9/43.5. MCV 65.6. Platelets 238 HbA1C 5.6% Chol 206, TG 61, HDL 53, LDL 138   Review of Systems  Cardiovascular: Negative for chest pain, dyspnea on exertion, leg swelling, palpitations and syncope.         Vitals:   04/07/20 1027 04/07/20 1032  BP: (!) 161/91 (!) 156/86  Pulse: 79 80  Resp: 16   SpO2: 96%      Body mass index is 33.51 kg/m.  Filed Weights   04/07/20 1027  Weight: 254 lb (115.2 kg)     Objective:   Physical Exam Vitals and nursing note reviewed.  Constitutional:      General: He is not in acute distress. Neck:     Vascular: No JVD.  Cardiovascular:     Rate and Rhythm: Normal rate and regular rhythm.     Heart sounds: Normal heart sounds. No murmur heard.   Pulmonary:     Effort: Pulmonary effort is normal.     Breath sounds: Normal breath sounds. No wheezing or rales.  Skin:    Findings: Rash:            Assessment & Recommendations:   74 y.o. African-American male with hypertension, paroxysmal atrial fibrillation.  Paroxysmal atrial fibrillation: In sinus rhythm today.  While in A. fib, rate was well controlled.  He is asymptomatic from A. fib  standpoint.  Continue rate control management.  Currently on Bystolic 10 mg daily. CHA2DS2Vasc score 2, annual stroke risk 2.2%.  His annual stroke risk will only increase as age progresses. Continue eliquis 5 mg bid.  Refer to sleep study for evaluation of OSA. Will obtain echocardiogram and Lexiscan nuclear stress test to evaluate for ischemia.  Hypertension: Suspect whitecoat hypertension.  No change made to his baseline antihypertensive therapy.  Hyperlipidemia: We will discuss adding a statin at next visit.  Thank you for referring the patient to [place]. Please feel free to contact with any questions.   Nigel Mormon, MD Pager: [phone] Office: [phone]

## 2020-04-07 ENCOUNTER — Encounter: Payer: Self-pay | Admitting: Cardiology

## 2020-04-07 ENCOUNTER — Ambulatory Visit: Payer: Medicare HMO | Admitting: Cardiology

## 2020-04-07 ENCOUNTER — Other Ambulatory Visit: Payer: Self-pay

## 2020-04-07 VITALS — BP 156/86 | HR 80 | Resp 16 | Ht 73.0 in | Wt 254.0 lb

## 2020-04-07 DIAGNOSIS — I480 Paroxysmal atrial fibrillation: Secondary | ICD-10-CM

## 2020-04-07 DIAGNOSIS — I10 Essential (primary) hypertension: Secondary | ICD-10-CM | POA: Diagnosis not present

## 2020-04-07 MED ORDER — APIXABAN 5 MG PO TABS: 5.0000 mg | ORAL_TABLET | Freq: Two times a day (BID) | ORAL | 2 refills | Status: DC

## 2020-04-07 NOTE — Addendum Note (Signed)
Addended by: Nigel Mormon on: 04/07/2020 02:10 PM   Modules accepted: Orders

## 2020-04-08 ENCOUNTER — Other Ambulatory Visit (HOSPITAL_COMMUNITY)
Admission: RE | Admit: 2020-04-08 | Discharge: 2020-04-08 | Disposition: A | Discharge status: Home / Self Care | Payer: Medicare HMO | Source: Ambulatory Visit | Attending: Gastroenterology | Admitting: Gastroenterology

## 2020-04-08 DIAGNOSIS — Z01812 Encounter for preprocedural laboratory examination: Secondary | ICD-10-CM | POA: Insufficient documentation

## 2020-04-08 DIAGNOSIS — Z20822 Contact with and (suspected) exposure to covid-19: Secondary | ICD-10-CM | POA: Diagnosis not present

## 2020-04-08 LAB — SARS CORONAVIRUS 2 (TAT 6-24 HRS): SARS Coronavirus 2: NEGATIVE

## 2020-04-11 ENCOUNTER — Ambulatory Visit: Payer: Medicare HMO

## 2020-04-11 ENCOUNTER — Other Ambulatory Visit: Payer: Self-pay

## 2020-04-11 ENCOUNTER — Other Ambulatory Visit: Payer: PRIVATE HEALTH INSURANCE

## 2020-04-11 DIAGNOSIS — I480 Paroxysmal atrial fibrillation: Secondary | ICD-10-CM | POA: Diagnosis not present

## 2020-04-12 ENCOUNTER — Ambulatory Visit: Payer: Medicare HMO

## 2020-04-12 DIAGNOSIS — I480 Paroxysmal atrial fibrillation: Secondary | ICD-10-CM | POA: Diagnosis not present

## 2020-04-13 NOTE — Progress Notes (Signed)
Called and spoke with patient regarding his stress test results.

## 2020-04-15 DIAGNOSIS — I480 Paroxysmal atrial fibrillation: Secondary | ICD-10-CM | POA: Diagnosis not present

## 2020-04-15 DIAGNOSIS — I10 Essential (primary) hypertension: Secondary | ICD-10-CM | POA: Diagnosis not present

## 2020-04-15 DIAGNOSIS — I483 Typical atrial flutter: Secondary | ICD-10-CM | POA: Diagnosis not present

## 2020-04-15 DIAGNOSIS — Z23 Encounter for immunization: Secondary | ICD-10-CM | POA: Diagnosis not present

## 2020-04-15 DIAGNOSIS — C61 Malignant neoplasm of prostate: Secondary | ICD-10-CM | POA: Diagnosis not present

## 2020-04-25 DIAGNOSIS — K862 Cyst of pancreas: Secondary | ICD-10-CM | POA: Diagnosis not present

## 2020-04-28 DIAGNOSIS — F064 Anxiety disorder due to known physiological condition: Secondary | ICD-10-CM | POA: Diagnosis not present

## 2020-04-28 DIAGNOSIS — I480 Paroxysmal atrial fibrillation: Secondary | ICD-10-CM | POA: Diagnosis not present

## 2020-05-04 ENCOUNTER — Telehealth: Payer: Self-pay

## 2020-05-04 NOTE — Telephone Encounter (Signed)
Ok to hold for now. Will discuss more during the upcoming office visit on 10/27

## 2020-05-04 NOTE — Telephone Encounter (Signed)
Telephone encounter:  Reason for call: Patient called and said that he wakes up in the morning and coughs up what looks like coffee all brown. He wanted to know if this is from the Eliquis. ?   Usual provider: MP  Last office visit: 04/07/20  Next office visit: 05/11/20   Last hospitalization: NA   Current Outpatient Medications on File Prior to Visit  Medication Sig Dispense Refill  . amLODipine (NORVASC) 10 MG tablet Take 10 mg by mouth daily.    Marland Kitchen apixaban (ELIQUIS) 5 MG TABS tablet Take 1 tablet (5 mg total) by mouth 2 (two) times daily. 60 tablet 2  . Artificial Tear Ointment (DRY EYES OP) Place 1 drop into both eyes as needed.    Marland Kitchen BREO ELLIPTA 100-25 MCG/INH AEPB as needed.    . irbesartan-hydrochlorothiazide (AVALIDE) 300-12.5 MG tablet Take 1 tablet by mouth daily.    . nebivolol (BYSTOLIC) 10 MG tablet Take 1 tablet by mouth daily.     No current facility-administered medications on file prior to visit.

## 2020-05-11 ENCOUNTER — Other Ambulatory Visit: Payer: Self-pay

## 2020-05-11 ENCOUNTER — Encounter: Payer: Self-pay | Admitting: Cardiology

## 2020-05-11 ENCOUNTER — Ambulatory Visit: Payer: Medicare HMO | Admitting: Cardiology

## 2020-05-11 VITALS — BP 158/86 | HR 75 | Resp 16 | Ht 73.0 in | Wt 253.0 lb

## 2020-05-11 DIAGNOSIS — I10 Essential (primary) hypertension: Secondary | ICD-10-CM | POA: Diagnosis not present

## 2020-05-11 DIAGNOSIS — I480 Paroxysmal atrial fibrillation: Secondary | ICD-10-CM | POA: Diagnosis not present

## 2020-05-11 DIAGNOSIS — E782 Mixed hyperlipidemia: Secondary | ICD-10-CM

## 2020-05-11 NOTE — Progress Notes (Signed)
Patient referred by [name], MD for atrial fibrillation  Subjective:   Gregory Odonnell, male    DOB: 30-Sep-1945, 74 y.o.   MRN: [id number]   Chief Complaint  Patient presents with  . Atrial Fibrillation  . Follow-up    4-6 week     HPI  74 y.o. African-American male with hypertension, paroxysmal atrial fibrillation.  Patient denies chest pain, shortness of breath, palpitations, leg edema, orthopnea, PND, TIA/syncope. He had mild bleeding from his tongue while on eliquis. This has since resolved. Blood pressure remains elevated, usually lower at home. Echocardiogram and stress test results reviewed with the patient, details below. He was recently started on Crestor 10 mg by his PCP.    Initial consultation HPI 03/2020: Patient is retired Therapist, music.  He has at longstanding hypertension, but denies any other medical comorbidities.  He was incidentally found to be in atrial fibrillation during his cataract surgery on September 1.  He since then followed up with his PCP, where he was found to be in sinus rhythm.  Patient denies any complaints of chest pain, shortness of breath.  That said, his physical activity is limited.  He used to snore in the past.  He has been tolerating Eliquis well.  Blood pressure is elevated today.  Reports home blood pressure to be around 130/70 mmHg.    Current Outpatient Medications on File Prior to Visit  Medication Sig Dispense Refill  . amLODipine (NORVASC) 10 MG tablet Take 10 mg by mouth daily.    Marland Kitchen apixaban (ELIQUIS) 5 MG TABS tablet Take 1 tablet (5 mg total) by mouth 2 (two) times daily. (Patient taking differently: Take 5 mg by mouth 2 (two) times daily. On hold 05/04/20 for side effects per MP) 60 tablet 2  . Artificial Tear Ointment (DRY EYES OP) Place 1 drop into both eyes as needed.    Marland Kitchen BREO ELLIPTA 100-25 MCG/INH AEPB as needed.    . irbesartan-hydrochlorothiazide (AVALIDE) 300-12.5 MG tablet Take 1 tablet by mouth daily.     . nebivolol (BYSTOLIC) 10 MG tablet Take 1 tablet by mouth daily.     No current facility-administered medications on file prior to visit.    Cardiovascular and other pertinent studies:  Echocardiogram 04/12/2020:  Left ventricle cavity is normal in size and wall thickness. Normal global  wall motion. Normal LV systolic function with EF 66%. Normal diastolic  filling pattern.  Left atrial cavity is normal in size.  Moderate (Grade II) mitral regurgitation.  Mild tricuspid regurgitation. Estimated pulmonary artery systolic pressure  39 mmHg.   Lexiscan (Walking with mod Bruce)Tetrofosmin Stress Test  04/11/2020: Nondiagnostic ECG stress. Resting EKG demonstrated normal sinus rhythm. Left QRS axis deviation in the presence of right bundle branch block. Peak EKG revealed no significant ST-T change from baseline abnormality. Myocardial perfusion is normal. Overall LV systolic function is normal without regional wall motion abnormalities. Stress LV EF: 76%.  No previous exam available for comparison. Low risk.   EKG 04/07/2020: Sinus rhythm  Occasional PAC   Right bundle branch block Anterolateral T wave inversion, consider ischemia   EKG 03/16/2020:  Atrial Fibrillation Right Bundle Branch Block Lateral T wave changes are nonspecific   Recent labs: 02/01/2020: Glucose 111, BUN/Cr 15/1.09. EGFR 77. Na/K 139/4.1. Rest of the CMP normal H/H 12.9/43.5. MCV 65.6. Platelets 238 HbA1C 5.6% Chol 206, TG 61, HDL 53, LDL 138   Review of Systems  Cardiovascular: Negative for chest pain, dyspnea on exertion, leg swelling,  palpitations and syncope.         Vitals:   05/11/20 1038  BP: (!) 158/86  Pulse: 75  Resp: 16  SpO2: 96%     Body mass index is 33.38 kg/m. Filed Weights   05/11/20 1038  Weight: 253 lb (114.8 kg)     Objective:   Physical Exam Vitals and nursing note reviewed.  Constitutional:      General: He is not in acute distress. Neck:     Vascular:  No JVD.  Cardiovascular:     Rate and Rhythm: Normal rate and regular rhythm.     Heart sounds: Normal heart sounds. No murmur heard.   Pulmonary:     Effort: Pulmonary effort is normal.     Breath sounds: Normal breath sounds. No wheezing or rales.  Skin:    Findings: Rash:            Assessment & Recommendations:   73 y.o. African-American male with hypertension, paroxysmal atrial fibrillation.  Paroxysmal atrial fibrillation: He is asymptomatic from A. fib standpoint.  Continue rate control management.  Currently on Bystolic 10 mg daily. CHA2DS2Vasc score 2, annual stroke risk 2.2%.  His annual stroke risk will only increase as age progresses. Continue eliquis 5 mg bid. Minor bleeding from tongue unlikely consequential.   Refer to sleep study for evaluation of OSA. Structurally normal heart, no severe valvular abnormalities.No isehmia on stress testing.  Hypertension: Suspect whitecoat hypertension.  No change made to his baseline antihypertensive therapy.  Hyperlipidemia: Continue Cresotr 10 mg.   F/u in 6 months   Nigel Mormon, MD Pager: [phone] Office: [phone]

## 2020-05-14 DIAGNOSIS — I480 Paroxysmal atrial fibrillation: Secondary | ICD-10-CM | POA: Diagnosis not present

## 2020-05-14 DIAGNOSIS — I483 Typical atrial flutter: Secondary | ICD-10-CM | POA: Diagnosis not present

## 2020-05-14 DIAGNOSIS — E7849 Other hyperlipidemia: Secondary | ICD-10-CM | POA: Diagnosis not present

## 2020-05-14 DIAGNOSIS — I10 Essential (primary) hypertension: Secondary | ICD-10-CM | POA: Diagnosis not present

## 2020-05-18 ENCOUNTER — Encounter: Payer: Self-pay | Admitting: Neurology

## 2020-05-18 ENCOUNTER — Other Ambulatory Visit: Payer: Self-pay

## 2020-05-18 ENCOUNTER — Ambulatory Visit: Payer: Medicare HMO | Admitting: Neurology

## 2020-05-18 VITALS — BP 151/84 | HR 73 | Ht 73.0 in | Wt 253.0 lb

## 2020-05-18 DIAGNOSIS — I480 Paroxysmal atrial fibrillation: Secondary | ICD-10-CM

## 2020-05-18 DIAGNOSIS — E669 Obesity, unspecified: Secondary | ICD-10-CM | POA: Diagnosis not present

## 2020-05-18 DIAGNOSIS — I499 Cardiac arrhythmia, unspecified: Secondary | ICD-10-CM | POA: Diagnosis not present

## 2020-05-18 DIAGNOSIS — R0683 Snoring: Secondary | ICD-10-CM | POA: Diagnosis not present

## 2020-05-18 DIAGNOSIS — R351 Nocturia: Secondary | ICD-10-CM

## 2020-05-18 NOTE — Progress Notes (Signed)
Subjective:    Patient ID: WOLF BOULAY is a 74 y.o. male.  HPI     Star Age, MD, PhD [facility] [address] [place], [place] [address]  Dear Dr. Virgina Jock,   I saw your patient, Aleczander Fandino, upon your kind request in my sleep clinic today for initial consultation of his sleep disorder, in particular, concern for underlying obstructive sleep apnea.  The patient is unaccompanied today.  As you know, Mr. Brannan is a 74 year old right-handed gentleman with an underlying medical history of paroxysmal A. fib, prostate cancer, hypertension, diverticulosis, arthritis, seasonal asthma, and mild obesity, who reports snoring and some difficulty with sleep initiation and sleep maintenance.  He was diagnosed with paroxysmal A. fib not too long ago, it was in the context of preparing for cataract surgery.  He does not have any significant symptoms such as shortness of breath or palpitations or chest pains.  Years ago his wife had voiced concern that he may have pauses in his breathing.  She has not complained about it lately.  They have been married 48 years.  They have 1 grown daughter, age 43 and 55 grandson, age 65, 64 grandchildren.  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  He is a non-smoker and drinks no alcohol, limited caffeine in the form of coffee, 2 cups/day on average.  He typically goes to bed around 10 or 11.  He denies recurrent morning headaches but has nocturia about twice per average night, no obvious or known family history of sleep apnea.  His snoring can be loud and disturbing to his wife but will fluctuate. I reviewed your office note from 05/11/2020.  His Epworth sleepiness score is 2 out of 24, fatigue severity score is 11 out of 63.  His Past Medical History Is Significant For: Past Medical History:  Diagnosis Date  . Arthritis    "maybe in my shoulders and one of my fingers on right hands" (11/24/2015)  .  Diverticulosis   . Hypertension   . Pancreatic lesion    routinely followed by [facility]  . Prostate cancer [facility])    prostate w/ ?mets to bladder  . Seasonal asthma    "sometimes in May"    His Past Surgical History Is Significant For: Past Surgical History:  Procedure Laterality Date  . BACK SURGERY    . BLADDER SURGERY    . [place]   "herniated disc"  . PROSTATECTOMY  2011  . TONSILLECTOMY  1950s    His Family History Is Significant For: Family History  Problem Relation Age of Onset  . Cancer Father        prostate  . Cancer Brother     His Social History Is Significant For: Social History   Socioeconomic History  . Marital status: Married    Spouse name: Not on file  . Number of children: 2  . Years of education: Not on file  . Highest education level: Not on file  Occupational History  . Occupation: retired  Tobacco Use  . Smoking status: Never Smoker  . Smokeless tobacco: Never Used  Vaping Use  . Vaping Use: Never used  Substance and Sexual Activity  . Alcohol use: Yes    Comment: 11/24/2015 "I drink once q 6-7 months"  . Drug use: Not Currently    Types: Marijuana    Comment: "back in the 1970s only"  . Sexual activity: Not Currently  Other  Topics Concern  . Not on file  Social History Narrative  . Not on file   Social Determinants of Health   Financial Resource Strain:   . Difficulty of Paying Living Expenses: Not on file  Food Insecurity:   . Worried About Charity fundraiser in the Last Year: Not on file  . Ran Out of Food in the Last Year: Not on file  Transportation Needs:   . Lack of Transportation (Medical): Not on file  . Lack of Transportation (Non-Medical): Not on file  Physical Activity:   . Days of Exercise per Week: Not on file  . Minutes of Exercise per Session: Not on file  Stress:   . Feeling of Stress : Not on file  Social Connections:   . Frequency of Communication with Friends and Family: Not on  file  . Frequency of Social Gatherings with Friends and Family: Not on file  . Attends Religious Services: Not on file  . Active Member of Clubs or Organizations: Not on file  . Attends Archivist Meetings: Not on file  . Marital Status: Not on file    His Allergies Are:  No Known Allergies:   His Current Medications Are:  Outpatient Encounter Medications as of 05/18/2020  Medication Sig  . amLODipine (NORVASC) 10 MG tablet Take 10 mg by mouth daily.  Marland Kitchen apixaban (ELIQUIS) 5 MG TABS tablet Take 1 tablet (5 mg total) by mouth 2 (two) times daily.  Marland Kitchen BREO ELLIPTA 100-25 MCG/INH AEPB as needed.  . irbesartan-hydrochlorothiazide (AVALIDE) 300-12.5 MG tablet Take 1 tablet by mouth daily.  . nebivolol (BYSTOLIC) 10 MG tablet Take 1 tablet by mouth daily.  . rosuvastatin (CRESTOR) 10 MG tablet Take 1 tablet by mouth daily.   No facility-administered encounter medications on file as of 05/18/2020.  :  Review of Systems:  Out of a complete 14 point review of systems, all are reviewed and negative with the exception of these symptoms as listed below: Review of Systems  Neurological:       Pt presents today to discuss his sleep. Pt has never had a sleep study but but does endorse snoring.  Epworth Sleepiness Scale 0= would never doze 1= slight chance of dozing 2= moderate chance of dozing 3= high chance of dozing  Sitting and reading: 0 Watching TV: 1 Sitting inactive in a public place (ex. Theater or meeting): 0 As a passenger in a car for an hour without a break: 0 Lying down to rest in the afternoon: 1 Sitting and talking to someone: 0 Sitting quietly after lunch (no alcohol): 0 In a car, while stopped in traffic: 0 Total: 2     Objective:  Neurological Exam  Physical Exam Physical Examination:   Vitals:   05/18/20 1534  BP: (!) 151/84  Pulse: 73    General Examination: The patient is a very pleasant 74 y.o. male in no acute distress. He appears  well-developed and well-nourished and well groomed.   HEENT: Normocephalic, atraumatic, pupils are equal, round and reactive to light, extraocular tracking is good without limitation to gaze excursion or nystagmus noted. Hearing is grossly intact. Face is symmetric with normal facial animation. Speech is clear with no dysarthria noted. There is no hypophonia. There is no lip, neck/head, jaw or voice tremor. Neck is supple with full range of passive and active motion. There are no carotid bruits on auscultation. Oropharynx exam reveals: mild mouth dryness, good dental hygiene and mild airway crowding, due  to redundant soft palate, tonsils are absent, Mallampati class I, neck circumference of 17.8 inches.  He has a minimal overbite.  Chest: Clear to auscultation without wheezing, rhonchi or crackles noted.  Heart: S1+S2+0, irregularly irregular, no murmurs, patient without symptoms.   Abdomen: Soft, non-tender and non-distended with normal bowel sounds appreciated on auscultation.  Extremities: There is no pitting edema in the distal lower extremities bilaterally.   Skin: Warm and dry without trophic changes noted.   Musculoskeletal: exam reveals no obvious joint deformities, tenderness or joint swelling or erythema.   Neurologically:  Mental status: The patient is awake, alert and oriented in all 4 spheres. His immediate and remote memory, attention, language skills and fund of knowledge are appropriate. There is no evidence of aphasia, agnosia, apraxia or anomia. Speech is clear with normal prosody and enunciation. Thought process is linear. Mood is normal and affect is normal.  Cranial nerves II - XII are as described above under HEENT exam.  Motor exam: Normal bulk, strength and tone is noted. There is no tremor, Romberg is negative. Fine motor skills and coordination: grossly intact.  Cerebellar testing: No dysmetria or intention tremor. There is no truncal or gait ataxia.  Sensory exam:  intact to light touch in the upper and lower extremities.  Gait, station and balance: He stands easily. No veering to one side is noted. No leaning to one side is noted. Posture is slightly stooped with increase in lumbar kyphosis. Stance is narrow based. Gait shows normal stride length and normal pace. No problems turning are noted. Tandem walk is unremarkable.                Assessment and Plan:   In summary, PARMVIR BOOMER is a very pleasant 74 y.o.-year old male with an underlying medical history of paroxysmal A. fib, prostate cancer, hypertension, diverticulosis, arthritis, seasonal asthma, and mild obesity, whose history and physical exam are concerning for obstructive sleep apnea (OSA). I had a long chat with the patient about my findings and the diagnosis of OSA, its prognosis and treatment options. We talked about medical treatments, surgical interventions and non-pharmacological approaches. I explained in particular the risks and ramifications of untreated moderate to severe OSA, especially with respect to developing cardiovascular disease down the Road, including congestive heart failure, difficult to treat hypertension, cardiac arrhythmias, or stroke. Even type 2 diabetes has, in part, been linked to untreated OSA. Symptoms of untreated OSA include daytime sleepiness, memory problems, mood irritability and mood disorder such as depression and anxiety, lack of energy, as well as recurrent headaches, especially morning headaches. We talked about trying to maintain a healthy lifestyle in general, as well as the importance of weight control. We also talked about the importance of good sleep hygiene. I recommended the following at this time: sleep study.  I explained the sleep test procedure to the patient and also outlined possible surgical and non-surgical treatment options of OSA, including the use of a custom-made dental device (which would require a referral to a specialist dentist or oral  surgeon), upper airway surgical options, such as traditional UPPP or a novel less invasive surgical option in the form of Inspire hypoglossal nerve stimulation (which would involve a referral to an ENT surgeon). I also explained the CPAP treatment option to the patient, who indicated that he would be willing to try CPAP if the need arises. I explained the importance of being compliant with PAP treatment, not only for insurance purposes but primarily to improve  His symptoms, and for the patient's long term health benefit, including to reduce His cardiovascular risks.  He may be in A. fib currently, I am not sure if he may have frequent PVCs, he is advised to call your office for further advice, whether he may need to come in for an EKG for example. He is on a blood thinner and is without symptoms.  I answered all his questions today and the patient was in agreement. I plan to see him back after the sleep study is completed and encouraged him to call with any interim questions, concerns, problems or updates.   Thank you very much for allowing me to participate in the care of this nice patient. If I can be of any further assistance to you please do not hesitate to call me at [phone].  Sincerely,   Star Age, MD, PhD

## 2020-05-18 NOTE — Patient Instructions (Addendum)
Thank you for choosing [facility] for your sleep related care! It was nice to meet you today! I appreciate that you entrust me with your sleep related healthcare concerns. I hope, I was able to address at least some of your concerns today, and that I can help you feel reassured and also get better.    Here is what we discussed today and what we came up with as our plan for you:   I think it is possible that you are currently in A. fib.  Since you do not have any specific symptoms and you are on a blood thinner, there may not be anything you need to do please check with Dr. Bonney Roussel office if you need to come in for an EKG.  I will send him a message as well.   Based on your symptoms and your exam I believe you are at risk for obstructive sleep apnea (aka OSA), and I think we should proceed with a sleep study to determine whether you do or do not have OSA and how severe it is. Even, if you have mild OSA, I may want you to consider treatment with CPAP, as treatment of even borderline or mild sleep apnea can result and improvement of symptoms such as sleep disruption, daytime sleepiness, nighttime bathroom breaks, restless leg symptoms, improvement of headache syndromes, even improved mood disorder.   As explained, an attended sleep study meaning you get to stay overnight in the sleep lab, lets [place] monitor sleep-related behaviors such as sleep talking and leg movements in sleep, in addition to monitoring for sleep apnea.  A home sleep test is a screening tool for sleep apnea only, and unfortunately does not help with any other sleep-related diagnoses.  Please remember, the long-term risks and ramifications of untreated moderate to severe obstructive sleep apnea are: increased Cardiovascular disease, including congestive heart failure, stroke, difficult to control hypertension, treatment resistant obesity, arrhythmias, especially irregular heartbeat commonly known as A. Fib. (atrial  fibrillation); even type 2 diabetes has been linked to untreated OSA.   Sleep apnea can cause disruption of sleep and sleep deprivation in most cases, which, in turn, can cause recurrent headaches, problems with memory, mood, concentration, focus, and vigilance. Most people with untreated sleep apnea report excessive daytime sleepiness, which can affect their ability to drive. Please do not drive if you feel sleepy. Patients with sleep apnea can also develop difficulty initiating and maintaining sleep (aka insomnia).   Having sleep apnea may increase your risk for other sleep disorders, including involuntary behaviors sleep such as sleep terrors, sleep talking, sleepwalking.    Having sleep apnea can also increase your risk for restless leg syndrome and leg movements at night.   Please note that untreated obstructive sleep apnea may carry additional perioperative morbidity. Patients with significant obstructive sleep apnea (typically, in the moderate to severe degree) should receive, if possible, perioperative PAP (positive airway pressure) therapy and the surgeons and particularly the anesthesiologists should be informed of the diagnosis and the severity of the sleep disordered breathing.   I will likely see you back after your sleep study to go over the test results and where to go from there. We will call you after your sleep study to advise about the results (most likely, you will hear from [facility], my nurse) and to set up an appointment at the time, as necessary.    Our sleep lab administrative assistant will call you to schedule your sleep study and give you further  instructions, regarding the check in process for the sleep study, arrival time, what to bring, when you can expect to leave after the study, etc., and to answer any other logistical questions you may have. If you don't hear back from her by about 2 weeks from now, please feel free to call her direct line at [phone] or you can call  our general clinic number, or email [place] through My Chart.

## 2020-05-25 ENCOUNTER — Telehealth: Payer: Self-pay

## 2020-05-25 NOTE — Telephone Encounter (Signed)
LVM for pt to call me back to schedule sleep study  

## 2020-05-27 DIAGNOSIS — I480 Paroxysmal atrial fibrillation: Secondary | ICD-10-CM | POA: Diagnosis not present

## 2020-05-27 DIAGNOSIS — F064 Anxiety disorder due to known physiological condition: Secondary | ICD-10-CM | POA: Diagnosis not present

## 2020-05-27 DIAGNOSIS — I10 Essential (primary) hypertension: Secondary | ICD-10-CM | POA: Diagnosis not present

## 2020-05-27 DIAGNOSIS — E782 Mixed hyperlipidemia: Secondary | ICD-10-CM | POA: Diagnosis not present

## 2020-06-01 DIAGNOSIS — I10 Essential (primary) hypertension: Secondary | ICD-10-CM | POA: Diagnosis not present

## 2020-06-01 DIAGNOSIS — Z0000 Encounter for general adult medical examination without abnormal findings: Secondary | ICD-10-CM | POA: Diagnosis not present

## 2020-06-06 ENCOUNTER — Telehealth: Payer: Self-pay

## 2020-06-06 NOTE — Telephone Encounter (Signed)
LVM for pt to call me back to schedule sleep study  

## 2020-06-07 DIAGNOSIS — I483 Typical atrial flutter: Secondary | ICD-10-CM | POA: Diagnosis not present

## 2020-06-07 DIAGNOSIS — I10 Essential (primary) hypertension: Secondary | ICD-10-CM | POA: Diagnosis not present

## 2020-06-07 DIAGNOSIS — R739 Hyperglycemia, unspecified: Secondary | ICD-10-CM | POA: Diagnosis not present

## 2020-06-15 DIAGNOSIS — I10 Essential (primary) hypertension: Secondary | ICD-10-CM | POA: Diagnosis not present

## 2020-06-15 DIAGNOSIS — R739 Hyperglycemia, unspecified: Secondary | ICD-10-CM | POA: Diagnosis not present

## 2020-06-15 DIAGNOSIS — I483 Typical atrial flutter: Secondary | ICD-10-CM | POA: Diagnosis not present

## 2020-06-15 DIAGNOSIS — I110 Hypertensive heart disease with heart failure: Secondary | ICD-10-CM | POA: Diagnosis not present

## 2020-06-20 ENCOUNTER — Telehealth: Payer: Self-pay

## 2020-06-20 NOTE — Telephone Encounter (Signed)
Patient called requesting a letter stating he needs to be on Eliquis so he can send it to [facility] and get the medication at a cheaper price please advise

## 2020-06-21 ENCOUNTER — Encounter: Payer: Self-pay | Admitting: Cardiology

## 2020-06-21 NOTE — Telephone Encounter (Signed)
Letter placed on chart. Please send it to the patient.   Thanks MJP

## 2020-06-21 NOTE — Telephone Encounter (Signed)
Called pt to inform him about his letter. Faxed it to [employer]

## 2020-06-27 ENCOUNTER — Ambulatory Visit (INDEPENDENT_AMBULATORY_CARE_PROVIDER_SITE_OTHER): Payer: Medicare HMO | Admitting: Neurology

## 2020-06-27 DIAGNOSIS — G4733 Obstructive sleep apnea (adult) (pediatric): Secondary | ICD-10-CM | POA: Diagnosis not present

## 2020-06-27 DIAGNOSIS — I480 Paroxysmal atrial fibrillation: Secondary | ICD-10-CM

## 2020-06-27 DIAGNOSIS — I499 Cardiac arrhythmia, unspecified: Secondary | ICD-10-CM

## 2020-06-27 DIAGNOSIS — E669 Obesity, unspecified: Secondary | ICD-10-CM

## 2020-06-27 DIAGNOSIS — R0683 Snoring: Secondary | ICD-10-CM

## 2020-06-27 DIAGNOSIS — R351 Nocturia: Secondary | ICD-10-CM

## 2020-07-10 NOTE — Procedures (Signed)
° °  [facility]  HOME SLEEP TEST (Watch PAT)  STUDY DATE: 06/27/20  DOB: 1946/07/05  MRN: [id number]  ORDERING CLINICIAN: Star Age, MD, PhD   REFERRING CLINICIAN: Dr. Virgina Jock  CLINICAL INFORMATION/HISTORY: 74 year old man with a history of paroxysmal A. fib, prostate cancer, hypertension, diverticulosis, arthritis, seasonal asthma, and mild obesity, who reports snoring and some difficulty with sleep initiation and sleep maintenance.   Epworth sleepiness score: 2/24.  Neck Circumference: 17.8 "  BMI: 33.6 kg/m  FINDINGS:   Total Record Time (hours, min): 9 H 0 min  Total Sleep Time (hours, min):  6 H 38 min   Percent REM (%):    14.56 %   Calculated pAHI (per hour):  18.1      REM pAHI:    33.7     NREM pAHI: 15.5   Oxygen Saturation (%) Mean: 93  Minimum oxygen saturation (%):        89   O2 Saturation Range (%): 89-97  O2Saturation (minutes) <=88%: 0 min   Pulse Mean (bpm):    58  Pulse Range (38-85)   IMPRESSION: OSA (obstructive sleep apnea)   RECOMMENDATION:  This home sleep test demonstrates moderate obstructive sleep apnea (by number of events) with a total AHI of 18.1/hour and O2 nadir of 89%. Treatment with positive airway pressure is recommended. The patient will be advised to proceed with an autoPAP titration/trial at home for now. A full night titration study may be considered to optimize treatment settings, if needed down the road. Please note that untreated obstructive sleep apnea may carry additional perioperative morbidity. Patients with significant obstructive sleep apnea should receive perioperative PAP therapy and the surgeons and particularly the anesthesiologist should be informed of the diagnosis and the severity of the sleep disordered breathing. The patient should be cautioned not to drive, work at heights, or operate dangerous or heavy equipment when tired or sleepy. Review and reiteration of good sleep hygiene measures  should be pursued with any patient. Other causes of the patient's symptoms, including circadian rhythm disturbances, an underlying mood disorder, medication effect and/or an underlying medical problem cannot be ruled out based on this test. Clinical correlation is recommended. The patient and his referring provider will be notified of the test results. The patient will be seen in follow up in sleep clinic at [facility].  I certify that I have reviewed the raw data recording prior to the issuance of this report in accordance with the standards of the American Academy of Sleep Medicine (AASM).  INTERPRETING PHYSICIAN:    Star Age, MD, PhD  Board Certified in Neurology and Sleep Medicine [facility] [address] [place], [place] [address] [phone]

## 2020-07-10 NOTE — Addendum Note (Signed)
Addended by: Star Age on: 07/10/2020 06:57 PM   Modules accepted: Orders

## 2020-07-10 NOTE — Progress Notes (Signed)
Patient referred by Dr. Virgina Jock, seen by me on 05/18/20, HST on 06/27/20.    Please call and notify the patient that the recent home sleep test showed obstructive sleep apnea in the moderate range. I recommend treatment in the form of autoPAP, which means, that we don't have to bring him in for a sleep study with CPAP, but will let him try an autoPAP machine at home, through a DME company (of his choice, or as per insurance requirement). The DME representative will educate him on how to use the machine, how to put the mask on, etc. I have placed an order in the chart. Please send referral, talk to patient, send report to referring MD. We will need a FU in sleep clinic for 10 weeks post-PAP set up, please arrange that with me or one of our NPs. Thanks,   Star Age, MD, PhD [facility])

## 2020-07-11 ENCOUNTER — Encounter: Payer: Self-pay | Admitting: Neurology

## 2020-07-11 ENCOUNTER — Telehealth: Payer: Self-pay | Admitting: Neurology

## 2020-07-11 NOTE — Telephone Encounter (Signed)
I called pt. I advised pt that Dr. Frances Furbish reviewed their sleep study results and found that pt has sleep apnea. Dr. Frances Furbish recommends that pt starts auto CPAP. I reviewed PAP compliance expectations with the pt. Pt is agreeable to starting a CPAP. I advised pt that an order will be sent to a DME, [facility], and [facility] will call the pt within about one week after they file with the pt's insurance. [facility] will show the pt how to use the machine, fit for masks, and troubleshoot the CPAP if needed. A follow up appt will need to be made for insurance purposes with Dr. Frances Furbish or NP 31-90 days from the date they pick up machine. Pt verbalized understanding to call our office to schedule the initial CPAP appointment. A letter with all of this information in it will be mailed to the pt as a reminder. I verified with the pt that the address we have on file is correct. Pt verbalized understanding of results. Pt had no questions at this time but was encouraged to call back if questions arise. I have sent the order to [facility] and have received confirmation that they have received the order.

## 2020-07-11 NOTE — Telephone Encounter (Signed)
-----   Message from Huston Foley, MD sent at 07/10/2020  6:57 PM EST ----- Patient referred by Dr. Rosemary Holms, seen by me on 05/18/20, HST on 06/27/20.    Please call and notify the patient that the recent home sleep test showed obstructive sleep apnea in the moderate range. I recommend treatment in the form of autoPAP, which means, that we don't have to bring him in for a sleep study with CPAP, but will let him try an autoPAP machine at home, through a DME company (of his choice, or as per insurance requirement). The DME representative will educate him on how to use the machine, how to put the mask on, etc. I have placed an order in the chart. Please send referral, talk to patient, send report to referring MD. We will need a FU in sleep clinic for 10 weeks post-PAP set up, please arrange that with me or one of our NPs. Thanks,   Huston Foley, MD, PhD [facility])

## 2020-07-15 DIAGNOSIS — I10 Essential (primary) hypertension: Secondary | ICD-10-CM | POA: Diagnosis not present

## 2020-07-15 DIAGNOSIS — E7849 Other hyperlipidemia: Secondary | ICD-10-CM | POA: Diagnosis not present

## 2020-07-15 DIAGNOSIS — I483 Typical atrial flutter: Secondary | ICD-10-CM | POA: Diagnosis not present

## 2020-07-15 DIAGNOSIS — I480 Paroxysmal atrial fibrillation: Secondary | ICD-10-CM | POA: Diagnosis not present

## 2020-07-19 DIAGNOSIS — E782 Mixed hyperlipidemia: Secondary | ICD-10-CM | POA: Diagnosis not present

## 2020-07-19 DIAGNOSIS — F064 Anxiety disorder due to known physiological condition: Secondary | ICD-10-CM | POA: Diagnosis not present

## 2020-07-19 DIAGNOSIS — I10 Essential (primary) hypertension: Secondary | ICD-10-CM | POA: Diagnosis not present

## 2020-07-20 DIAGNOSIS — R739 Hyperglycemia, unspecified: Secondary | ICD-10-CM | POA: Diagnosis not present

## 2020-07-20 DIAGNOSIS — I483 Typical atrial flutter: Secondary | ICD-10-CM | POA: Diagnosis not present

## 2020-07-20 DIAGNOSIS — I10 Essential (primary) hypertension: Secondary | ICD-10-CM | POA: Diagnosis not present

## 2020-07-20 DIAGNOSIS — D649 Anemia, unspecified: Secondary | ICD-10-CM | POA: Diagnosis not present

## 2020-07-20 DIAGNOSIS — E782 Mixed hyperlipidemia: Secondary | ICD-10-CM | POA: Diagnosis not present

## 2020-08-10 ENCOUNTER — Other Ambulatory Visit: Payer: Self-pay | Admitting: Cardiology

## 2020-08-10 DIAGNOSIS — I480 Paroxysmal atrial fibrillation: Secondary | ICD-10-CM

## 2020-08-18 ENCOUNTER — Telehealth: Payer: Self-pay

## 2020-08-18 NOTE — Telephone Encounter (Signed)
error 

## 2020-08-23 DIAGNOSIS — R9721 Rising PSA following treatment for malignant neoplasm of prostate: Secondary | ICD-10-CM | POA: Diagnosis not present

## 2020-08-23 DIAGNOSIS — Z8551 Personal history of malignant neoplasm of bladder: Secondary | ICD-10-CM | POA: Diagnosis not present

## 2020-08-23 DIAGNOSIS — Z8546 Personal history of malignant neoplasm of prostate: Secondary | ICD-10-CM | POA: Diagnosis not present

## 2020-08-26 ENCOUNTER — Ambulatory Visit: Payer: Medicare HMO | Admitting: Student

## 2020-08-26 ENCOUNTER — Other Ambulatory Visit: Payer: Self-pay

## 2020-08-26 ENCOUNTER — Encounter: Payer: Self-pay | Admitting: Student

## 2020-08-26 VITALS — BP 154/89 | HR 66 | Temp 98.2°F | Wt 247.0 lb

## 2020-08-26 DIAGNOSIS — R0601 Orthopnea: Secondary | ICD-10-CM

## 2020-08-26 DIAGNOSIS — R600 Localized edema: Secondary | ICD-10-CM

## 2020-08-26 DIAGNOSIS — I10 Essential (primary) hypertension: Secondary | ICD-10-CM | POA: Diagnosis not present

## 2020-08-26 MED ORDER — HYDROCHLOROTHIAZIDE 12.5 MG PO CAPS: 12.5000 mg | ORAL_CAPSULE | Freq: Every day | ORAL | 3 refills | Status: DC

## 2020-08-26 NOTE — Addendum Note (Signed)
Addended by: Rosezena Sensor on: 08/26/2020 02:22 PM   Modules accepted: Orders

## 2020-08-26 NOTE — Progress Notes (Signed)
Patient referred by [name], MD for atrial fibrillation  Subjective:   Gregory Odonnell, male    DOB: 05-03-1946, 75 y.o.   MRN: [id number]   Chief Complaint  Patient presents with  . Follow-up  . Hypertension     HPI  75 y.o. African-American male with hypertension, paroxysmal atrial fibrillation.  Patient presents for urgent visit with concerns that he can see pulsations in his neck over the last 2 weeks. Patient denies chest pain, palpitations, dyspnea, syncope, near-syncope, PND. He has bilateral lower leg edema, which he reports is at baseline. Patient also endorses orthopnea, and has needed to use 1 extra pillow at night to sleep. Notably patient has been diagnosed with sleep apnea, but is awaiting CPAP machine due to national supple shortage. He reports home blood pressure readings that are above goal. He is tolerating Eliquis without bleeding diathesis. Echocardiogram 5 months ago revealed normal LVEF and normal diastolic filling patter.    Current Outpatient Medications on File Prior to Visit  Medication Sig Dispense Refill  . amLODipine (NORVASC) 10 MG tablet Take 10 mg by mouth daily.    Marland Kitchen BREO ELLIPTA 100-25 MCG/INH AEPB as needed.    Marland Kitchen ELIQUIS 5 MG TABS tablet TAKE ONE TABLET BY MOUTH TWICE DAILY 60 tablet 0  . irbesartan-hydrochlorothiazide (AVALIDE) 300-12.5 MG tablet Take 1 tablet by mouth daily.    . nebivolol (BYSTOLIC) 10 MG tablet Take 1 tablet by mouth daily.    . rosuvastatin (CRESTOR) 10 MG tablet Take 1 tablet by mouth daily.     No current facility-administered medications on file prior to visit.    Cardiovascular and other pertinent studies: EKG 08/26/2020:  Atrial Fibrillation with controlled ventricular response  Right Bundle Branch Block Lateral T wave changes are nonspecific  Echocardiogram 04/12/2020:  Left ventricle cavity is normal in size and wall thickness. Normal global  wall motion. Normal LV systolic function with EF 66%. Normal  diastolic  filling pattern.  Left atrial cavity is normal in size.  Moderate (Grade II) mitral regurgitation.  Mild tricuspid regurgitation. Estimated pulmonary artery systolic pressure  39 mmHg.   Lexiscan (Walking with mod Bruce)Tetrofosmin Stress Test  04/11/2020: Nondiagnostic ECG stress. Resting EKG demonstrated normal sinus rhythm. Left QRS axis deviation in the presence of right bundle branch block. Peak EKG revealed no significant ST-T change from baseline abnormality. Myocardial perfusion is normal. Overall LV systolic function is normal without regional wall motion abnormalities. Stress LV EF: 76%.  No previous exam available for comparison. Low risk.   EKG 04/07/2020: Sinus rhythm  Occasional PAC   Right bundle branch block Anterolateral T wave inversion, consider ischemia   EKG 03/16/2020:  Atrial Fibrillation Right Bundle Branch Block Lateral T wave changes are nonspecific   Recent labs: 02/01/2020: Glucose 111, BUN/Cr 15/1.09. EGFR 77. Na/K 139/4.1. Rest of the CMP normal H/H 12.9/43.5. MCV 65.6. Platelets 238 HbA1C 5.6% Chol 206, TG 61, HDL 53, LDL 138   Review of Systems  Constitutional: Negative for malaise/fatigue and weight gain.  Cardiovascular: Positive for leg swelling and orthopnea. Negative for chest pain, claudication, dyspnea on exertion, near-syncope, palpitations, paroxysmal nocturnal dyspnea and syncope.  Respiratory: Negative for shortness of breath.   Hematologic/Lymphatic: Does not bruise/bleed easily.  Gastrointestinal: Negative for melena.  Neurological: Negative for dizziness and weakness.         Vitals:   08/26/20 1128  BP: (!) 154/89  Pulse: 66  Temp: 98.2 F (36.8 C)  SpO2: 97%  Body mass index is 32.59 kg/m. Filed Weights   08/26/20 1128  Weight: 247 lb (112 kg)     Objective:   Physical Exam Vitals and nursing note reviewed.  Constitutional:      General: He is not in acute distress. HENT:     Head:  Normocephalic and atraumatic.  Neck:     Vascular: No carotid bruit or JVD.  Cardiovascular:     Rate and Rhythm: Normal rate and regular rhythm.     Pulses: Intact distal pulses.     Heart sounds: Normal heart sounds, S1 normal and S2 normal. No murmur heard. No gallop.   Pulmonary:     Effort: Pulmonary effort is normal. No respiratory distress.     Breath sounds: Normal breath sounds. No wheezing, rhonchi or rales.  Musculoskeletal:     Right lower leg: Edema (1+ pitting ) present.     Left lower leg: Edema (trace) present.  Skin:    General: Skin is warm and dry.     Capillary Refill: Capillary refill takes less than 2 seconds.     Findings: Rash:    Neurological:     General: No focal deficit present.     Mental Status: He is alert and oriented to person, place, and time.          Assessment & Recommendations:   75 y.o. African-American male with hypertension, paroxysmal atrial fibrillation.  Bilateral leg edema:  There is no JVD noted on exam as patient was concerned there is. However he does have increased bilateral leg edema compared to prior visit.  Obtain BNP and BMP now. Repeat labs in 1 week.  Will increase HCTZ from 12.5 mg to 25 mg daily for blood pressure control and to improve leg edema.  Can consider repeat echocardiogram if BNP significantly elevated, however echo 5 months ago showed normal LVEF and diastolic filling pattern.   Orthopnea:  Suspect this is related to underlying untreated sleep apnea.  Further recommendations as above.   Hypertension:  Uncontrolled in the office and according to home readings.  Will increase HCTZ from 12.5 mg to 25 mg daily. This will likely improve leg edema as well.  Repeat BMP in 1 week.  If blood pressure remains uncontrolled at next visit can consider enrolling in remote patient monitoring.   Paroxysmal atrial fibrillation: He is asymptomatic from A. fib standpoint.  Continue rate control management.  Currently on  Bystolic 10 mg daily. CHA2DS2Vasc score 2, annual stroke risk 2.2%.  His annual stroke risk will only increase as age progresses. Continue eliquis 5 mg bid.  Recommend patient continue to follow up regarding CPAP treatment for sleep apnea.  Structurally normal heart, no severe valvular abnormalities. No isehmia on stress testing.  Hyperlipidemia: Continue Cresotr 10 mg.   F/u in 4 weeks for hypertension and leg edema.    Alethia Berthold, PA-C 08/26/2020, 2:15 PM Office: [phone]

## 2020-08-27 LAB — BASIC METABOLIC PANEL
BUN/Creatinine Ratio: 11 (ref 10–24)
BUN: 12 mg/dL (ref 8–27)
CO2: 19 mmol/L — ABNORMAL LOW (ref 20–29)
Calcium: 9.3 mg/dL (ref 8.6–10.2)
Chloride: 106 mmol/L (ref 96–106)
Creatinine, Ser: 1.13 mg/dL (ref 0.76–1.27)
GFR calc Af Amer: 73 mL/min/{1.73_m2} (ref >59)
GFR calc non Af Amer: 63 mL/min/{1.73_m2} (ref >59)
Glucose: 90 mg/dL (ref 65–99)
Potassium: 4.5 mmol/L (ref 3.5–5.2)
Sodium: 142 mmol/L (ref 134–144)

## 2020-08-27 LAB — BRAIN NATRIURETIC PEPTIDE: BNP: 442.3 pg/mL — ABNORMAL HIGH (ref 0.0–100.0)

## 2020-08-29 NOTE — Progress Notes (Signed)
Called pt to inform him about his lab results. Pt understood

## 2020-08-29 NOTE — Progress Notes (Signed)
Please inform patient his BNP is mildly elevated. We will continue to monitor as needed clinically. Will discuss further at next office visit.

## 2020-09-02 DIAGNOSIS — I10 Essential (primary) hypertension: Secondary | ICD-10-CM | POA: Diagnosis not present

## 2020-09-02 DIAGNOSIS — R0601 Orthopnea: Secondary | ICD-10-CM | POA: Diagnosis not present

## 2020-09-02 DIAGNOSIS — R600 Localized edema: Secondary | ICD-10-CM | POA: Diagnosis not present

## 2020-09-03 LAB — BASIC METABOLIC PANEL
BUN/Creatinine Ratio: 13 (ref 10–24)
BUN: 14 mg/dL (ref 8–27)
CO2: 18 mmol/L — ABNORMAL LOW (ref 20–29)
Calcium: 9.4 mg/dL (ref 8.6–10.2)
Chloride: 104 mmol/L (ref 96–106)
Creatinine, Ser: 1.11 mg/dL (ref 0.76–1.27)
GFR calc Af Amer: 75 mL/min/{1.73_m2} (ref >59)
GFR calc non Af Amer: 65 mL/min/{1.73_m2} (ref >59)
Glucose: 105 mg/dL — ABNORMAL HIGH (ref 65–99)
Potassium: 4.2 mmol/L (ref 3.5–5.2)
Sodium: 138 mmol/L (ref 134–144)

## 2020-09-03 LAB — BRAIN NATRIURETIC PEPTIDE: BNP: 436.6 pg/mL — ABNORMAL HIGH (ref 0.0–100.0)

## 2020-09-11 ENCOUNTER — Other Ambulatory Visit: Payer: Self-pay | Admitting: Cardiology

## 2020-09-11 DIAGNOSIS — I480 Paroxysmal atrial fibrillation: Secondary | ICD-10-CM

## 2020-09-12 ENCOUNTER — Other Ambulatory Visit: Payer: Self-pay | Admitting: Cardiology

## 2020-09-12 DIAGNOSIS — I480 Paroxysmal atrial fibrillation: Secondary | ICD-10-CM

## 2020-09-16 DIAGNOSIS — C61 Malignant neoplasm of prostate: Secondary | ICD-10-CM | POA: Diagnosis not present

## 2020-09-20 DIAGNOSIS — C61 Malignant neoplasm of prostate: Secondary | ICD-10-CM | POA: Diagnosis not present

## 2020-09-22 NOTE — Progress Notes (Signed)
Patient referred by [name], MD for atrial fibrillation  Subjective:   Gregory Odonnell, male    DOB: 1945-10-02, 75 y.o.   MRN: [id number]   Chief Complaint  Patient presents with   Hypertension   Edema   Follow-up     HPI  75 y.o. African-American male with hypertension, paroxysmal atrial fibrillation.  Patient presents for 4-week follow-up of hypertension and bilateral lower leg edema.  At last visit increased hydrochlorothiazide from 12.5 mg to 25 mg daily.  Since last visit patient has been feeling well.  He reports home blood pressure readings that are under excellent control, averaging 127/75 mmHg.  He reports leg swelling has improved with additional 12.5 mg of hydrochlorothiazide.  He continues to await CPAP machine due to [employer].  Patient denies chest pain, palpitations, dyspnea, syncope, near syncope, PND, orthopnea. Echocardiogram 5 months ago revealed normal LVEF and normal diastolic filling patter.    Current Outpatient Medications on File Prior to Visit  Medication Sig Dispense Refill   amLODipine (NORVASC) 10 MG tablet Take 10 mg by mouth daily.     BREO ELLIPTA 100-25 MCG/INH AEPB as needed.     hydrochlorothiazide (MICROZIDE) 12.5 MG capsule Take 1 capsule (12.5 mg total) by mouth daily. 30 capsule 3   irbesartan-hydrochlorothiazide (AVALIDE) 300-12.5 MG tablet Take 1 tablet by mouth daily.     nebivolol (BYSTOLIC) 10 MG tablet Take 1 tablet by mouth daily.     rosuvastatin (CRESTOR) 10 MG tablet Take 1 tablet by mouth daily.     No current facility-administered medications on file prior to visit.    Cardiovascular and other pertinent studies: EKG 08/26/2020:  Atrial Fibrillation with controlled ventricular response  Right Bundle Branch Block Lateral T wave changes are nonspecific  Echocardiogram 04/12/2020:  Left ventricle cavity is normal in size and wall thickness. Normal global  wall motion. Normal LV systolic function with  EF 66%. Normal diastolic  filling pattern.  Left atrial cavity is normal in size.  Moderate (Grade II) mitral regurgitation.  Mild tricuspid regurgitation. Estimated pulmonary artery systolic pressure  39 mmHg.   Lexiscan (Walking with mod Bruce)Tetrofosmin Stress Test  04/11/2020: Nondiagnostic ECG stress. Resting EKG demonstrated normal sinus rhythm. Left QRS axis deviation in the presence of right bundle branch block. Peak EKG revealed no significant ST-T change from baseline abnormality. Myocardial perfusion is normal. Overall LV systolic function is normal without regional wall motion abnormalities. Stress LV EF: 76%.  No previous exam available for comparison. Low risk.   EKG 04/07/2020: Sinus rhythm  Occasional PAC   Right bundle branch block Anterolateral T wave inversion, consider ischemia   EKG 03/16/2020:  Atrial Fibrillation Right Bundle Branch Block Lateral T wave changes are nonspecific   Recent labs: 09/02/2020: Glucose 105, BUN 14, creatinine 1.11, GFR 65, sodium 130, potassium 4.2  BNP 436  02/01/2020: Glucose 111, BUN/Cr 15/1.09. EGFR 77. Na/K 139/4.1. Rest of the CMP normal H/H 12.9/43.5. MCV 65.6. Platelets 238 HbA1C 5.6% Chol 206, TG 61, HDL 53, LDL 138   Review of Systems  Constitutional: Negative for malaise/fatigue and weight gain.  Cardiovascular: Positive for leg swelling. Negative for chest pain, claudication, dyspnea on exertion, near-syncope, orthopnea, palpitations, paroxysmal nocturnal dyspnea and syncope.  Respiratory: Negative for shortness of breath.   Hematologic/Lymphatic: Does not bruise/bleed easily.  Gastrointestinal: Negative for melena.  Neurological: Negative for dizziness and weakness.         Vitals:   09/23/20 1327 09/23/20 1330  BP: Marland Kitchen)  152/82 (!) 148/82  Pulse: 77 76  Resp: 16   Temp: 98.2 F (36.8 C)   SpO2: 97% 97%     Body mass index is 32.4 kg/m. Filed Weights   09/23/20 1327  Weight: 245 lb 9.6 oz  (111.4 kg)     Objective:   Physical Exam Vitals and nursing note reviewed.  Constitutional:      General: He is not in acute distress. HENT:     Head: Normocephalic and atraumatic.  Neck:     Vascular: No carotid bruit or JVD.  Cardiovascular:     Rate and Rhythm: Normal rate and regular rhythm.     Pulses: Intact distal pulses.     Heart sounds: Normal heart sounds, S1 normal and S2 normal. No murmur heard. No gallop.   Pulmonary:     Effort: Pulmonary effort is normal. No respiratory distress.     Breath sounds: Normal breath sounds. No wheezing, rhonchi or rales.  Musculoskeletal:     Right lower leg: Edema (1+ pitting ) present.     Left lower leg: Edema (1+ pitting) present.  Skin:    General: Skin is warm and dry.     Capillary Refill: Capillary refill takes less than 2 seconds.     Findings: Rash:    Neurological:     General: No focal deficit present.     Mental Status: He is alert and oriented to person, place, and time.          Assessment & Recommendations:   75 y.o. African-American male with hypertension, paroxysmal atrial fibrillation.  Bilateral leg edema:  Patient's BNP mildly elevated and he continues to have bilateral lower leg edema. Recommend furosemide 20 mg daily as needed for volume overload.  Patient verbalized understanding of recommended dosing schedule.  Patient will take furosemide 20 mg daily for the next 3 days and monitor for improvement of bilateral lower leg edema.  Orthopnea:  Patient reports orthopnea has improved since last visit.  Although he continues to require multiple pillows to sleep on at night.  This is likely due to underlying untreated sleep apnea.  Recommend patient continue to follow-up regarding CPAP machine.  Hypertension:  Blood pressure is now well controlled.  We will continue current antihypertensive medications as renal function has remained stable.  Paroxysmal atrial fibrillation: He is asymptomatic from A.  fib standpoint.  Continue rate control management.  Currently on Bystolic 10 mg daily. CHA2DS2Vasc score 2, annual stroke risk 2.2%.  His annual stroke risk will only increase as age progresses. Continue eliquis 5 mg bid.  Structurally normal heart, no severe valvular abnormalities. No isehmia on stress testing.  Hyperlipidemia: Continue Cresotr 10 mg.   Follow-up in 6 months, sooner if needed, for hypertension, paroxysmal atrial fibrillation, hyperlipidemia.    Alethia Berthold, PA-C 09/23/2020, 4:07 PM Office: [phone]

## 2020-09-23 ENCOUNTER — Encounter: Payer: Self-pay | Admitting: Student

## 2020-09-23 ENCOUNTER — Ambulatory Visit: Payer: Medicare HMO | Admitting: Student

## 2020-09-23 ENCOUNTER — Other Ambulatory Visit: Payer: Self-pay

## 2020-09-23 VITALS — BP 148/82 | HR 76 | Temp 98.2°F | Resp 16 | Ht 73.0 in | Wt 245.6 lb

## 2020-09-23 DIAGNOSIS — R600 Localized edema: Secondary | ICD-10-CM | POA: Diagnosis not present

## 2020-09-23 DIAGNOSIS — I480 Paroxysmal atrial fibrillation: Secondary | ICD-10-CM

## 2020-09-23 DIAGNOSIS — I10 Essential (primary) hypertension: Secondary | ICD-10-CM | POA: Diagnosis not present

## 2020-09-23 MED ORDER — APIXABAN 5 MG PO TABS: 5.0000 mg | ORAL_TABLET | Freq: Two times a day (BID) | ORAL | 3 refills | Status: DC

## 2020-09-23 MED ORDER — FUROSEMIDE 20 MG PO TABS: 20.0000 mg | ORAL_TABLET | Freq: Every day | ORAL | 3 refills | Status: DC | PRN

## 2020-09-26 DIAGNOSIS — C61 Malignant neoplasm of prostate: Secondary | ICD-10-CM | POA: Diagnosis not present

## 2020-10-13 DIAGNOSIS — I10 Essential (primary) hypertension: Secondary | ICD-10-CM | POA: Diagnosis not present

## 2020-10-13 DIAGNOSIS — E7849 Other hyperlipidemia: Secondary | ICD-10-CM | POA: Diagnosis not present

## 2020-10-13 DIAGNOSIS — I480 Paroxysmal atrial fibrillation: Secondary | ICD-10-CM | POA: Diagnosis not present

## 2020-10-19 DIAGNOSIS — I480 Paroxysmal atrial fibrillation: Secondary | ICD-10-CM | POA: Diagnosis not present

## 2020-10-19 DIAGNOSIS — I10 Essential (primary) hypertension: Secondary | ICD-10-CM | POA: Diagnosis not present

## 2020-10-19 DIAGNOSIS — R739 Hyperglycemia, unspecified: Secondary | ICD-10-CM | POA: Diagnosis not present

## 2020-10-21 DIAGNOSIS — Z961 Presence of intraocular lens: Secondary | ICD-10-CM | POA: Diagnosis not present

## 2020-11-09 ENCOUNTER — Ambulatory Visit: Payer: PRIVATE HEALTH INSURANCE | Admitting: Cardiology

## 2020-11-14 DIAGNOSIS — C61 Malignant neoplasm of prostate: Secondary | ICD-10-CM | POA: Diagnosis not present

## 2020-11-16 DIAGNOSIS — Z510 Encounter for antineoplastic radiation therapy: Secondary | ICD-10-CM | POA: Diagnosis not present

## 2020-11-16 DIAGNOSIS — C61 Malignant neoplasm of prostate: Secondary | ICD-10-CM | POA: Diagnosis not present

## 2020-11-21 ENCOUNTER — Ambulatory Visit: Payer: PRIVATE HEALTH INSURANCE | Admitting: Cardiology

## 2020-11-23 DIAGNOSIS — C61 Malignant neoplasm of prostate: Secondary | ICD-10-CM | POA: Diagnosis not present

## 2020-11-23 DIAGNOSIS — Z510 Encounter for antineoplastic radiation therapy: Secondary | ICD-10-CM | POA: Diagnosis not present

## 2020-11-24 DIAGNOSIS — Z510 Encounter for antineoplastic radiation therapy: Secondary | ICD-10-CM | POA: Diagnosis not present

## 2020-11-24 DIAGNOSIS — C61 Malignant neoplasm of prostate: Secondary | ICD-10-CM | POA: Diagnosis not present

## 2020-11-25 DIAGNOSIS — C61 Malignant neoplasm of prostate: Secondary | ICD-10-CM | POA: Diagnosis not present

## 2020-11-25 DIAGNOSIS — Z510 Encounter for antineoplastic radiation therapy: Secondary | ICD-10-CM | POA: Diagnosis not present

## 2020-11-28 DIAGNOSIS — Z510 Encounter for antineoplastic radiation therapy: Secondary | ICD-10-CM | POA: Diagnosis not present

## 2020-11-28 DIAGNOSIS — C61 Malignant neoplasm of prostate: Secondary | ICD-10-CM | POA: Diagnosis not present

## 2020-11-29 DIAGNOSIS — Z510 Encounter for antineoplastic radiation therapy: Secondary | ICD-10-CM | POA: Diagnosis not present

## 2020-11-29 DIAGNOSIS — C61 Malignant neoplasm of prostate: Secondary | ICD-10-CM | POA: Diagnosis not present

## 2020-12-01 DIAGNOSIS — C61 Malignant neoplasm of prostate: Secondary | ICD-10-CM | POA: Diagnosis not present

## 2020-12-01 DIAGNOSIS — Z510 Encounter for antineoplastic radiation therapy: Secondary | ICD-10-CM | POA: Diagnosis not present

## 2020-12-02 DIAGNOSIS — Z510 Encounter for antineoplastic radiation therapy: Secondary | ICD-10-CM | POA: Diagnosis not present

## 2020-12-02 DIAGNOSIS — C61 Malignant neoplasm of prostate: Secondary | ICD-10-CM | POA: Diagnosis not present

## 2020-12-05 DIAGNOSIS — Z510 Encounter for antineoplastic radiation therapy: Secondary | ICD-10-CM | POA: Diagnosis not present

## 2020-12-05 DIAGNOSIS — C61 Malignant neoplasm of prostate: Secondary | ICD-10-CM | POA: Diagnosis not present

## 2020-12-06 DIAGNOSIS — C61 Malignant neoplasm of prostate: Secondary | ICD-10-CM | POA: Diagnosis not present

## 2020-12-06 DIAGNOSIS — Z510 Encounter for antineoplastic radiation therapy: Secondary | ICD-10-CM | POA: Diagnosis not present

## 2020-12-07 DIAGNOSIS — Z510 Encounter for antineoplastic radiation therapy: Secondary | ICD-10-CM | POA: Diagnosis not present

## 2020-12-07 DIAGNOSIS — C61 Malignant neoplasm of prostate: Secondary | ICD-10-CM | POA: Diagnosis not present

## 2020-12-08 DIAGNOSIS — Z510 Encounter for antineoplastic radiation therapy: Secondary | ICD-10-CM | POA: Diagnosis not present

## 2020-12-08 DIAGNOSIS — C61 Malignant neoplasm of prostate: Secondary | ICD-10-CM | POA: Diagnosis not present

## 2020-12-09 ENCOUNTER — Other Ambulatory Visit: Payer: Self-pay | Admitting: Student

## 2020-12-09 DIAGNOSIS — Z510 Encounter for antineoplastic radiation therapy: Secondary | ICD-10-CM | POA: Diagnosis not present

## 2020-12-09 DIAGNOSIS — C61 Malignant neoplasm of prostate: Secondary | ICD-10-CM | POA: Diagnosis not present

## 2020-12-13 DIAGNOSIS — C61 Malignant neoplasm of prostate: Secondary | ICD-10-CM | POA: Diagnosis not present

## 2020-12-13 DIAGNOSIS — Z510 Encounter for antineoplastic radiation therapy: Secondary | ICD-10-CM | POA: Diagnosis not present

## 2020-12-14 DIAGNOSIS — C61 Malignant neoplasm of prostate: Secondary | ICD-10-CM | POA: Diagnosis not present

## 2020-12-14 DIAGNOSIS — Z510 Encounter for antineoplastic radiation therapy: Secondary | ICD-10-CM | POA: Diagnosis not present

## 2020-12-15 DIAGNOSIS — Z510 Encounter for antineoplastic radiation therapy: Secondary | ICD-10-CM | POA: Diagnosis not present

## 2020-12-15 DIAGNOSIS — C61 Malignant neoplasm of prostate: Secondary | ICD-10-CM | POA: Diagnosis not present

## 2020-12-16 DIAGNOSIS — Z510 Encounter for antineoplastic radiation therapy: Secondary | ICD-10-CM | POA: Diagnosis not present

## 2020-12-16 DIAGNOSIS — C61 Malignant neoplasm of prostate: Secondary | ICD-10-CM | POA: Diagnosis not present

## 2020-12-19 DIAGNOSIS — Z510 Encounter for antineoplastic radiation therapy: Secondary | ICD-10-CM | POA: Diagnosis not present

## 2020-12-19 DIAGNOSIS — C61 Malignant neoplasm of prostate: Secondary | ICD-10-CM | POA: Diagnosis not present

## 2020-12-20 DIAGNOSIS — C61 Malignant neoplasm of prostate: Secondary | ICD-10-CM | POA: Diagnosis not present

## 2020-12-20 DIAGNOSIS — Z510 Encounter for antineoplastic radiation therapy: Secondary | ICD-10-CM | POA: Diagnosis not present

## 2020-12-21 DIAGNOSIS — C61 Malignant neoplasm of prostate: Secondary | ICD-10-CM | POA: Diagnosis not present

## 2020-12-21 DIAGNOSIS — Z510 Encounter for antineoplastic radiation therapy: Secondary | ICD-10-CM | POA: Diagnosis not present

## 2020-12-22 DIAGNOSIS — Z510 Encounter for antineoplastic radiation therapy: Secondary | ICD-10-CM | POA: Diagnosis not present

## 2020-12-22 DIAGNOSIS — C61 Malignant neoplasm of prostate: Secondary | ICD-10-CM | POA: Diagnosis not present

## 2020-12-23 DIAGNOSIS — C61 Malignant neoplasm of prostate: Secondary | ICD-10-CM | POA: Diagnosis not present

## 2020-12-23 DIAGNOSIS — Z510 Encounter for antineoplastic radiation therapy: Secondary | ICD-10-CM | POA: Diagnosis not present

## 2020-12-26 DIAGNOSIS — C61 Malignant neoplasm of prostate: Secondary | ICD-10-CM | POA: Diagnosis not present

## 2020-12-26 DIAGNOSIS — Z510 Encounter for antineoplastic radiation therapy: Secondary | ICD-10-CM | POA: Diagnosis not present

## 2020-12-27 DIAGNOSIS — C61 Malignant neoplasm of prostate: Secondary | ICD-10-CM | POA: Diagnosis not present

## 2020-12-27 DIAGNOSIS — Z510 Encounter for antineoplastic radiation therapy: Secondary | ICD-10-CM | POA: Diagnosis not present

## 2020-12-28 DIAGNOSIS — Z510 Encounter for antineoplastic radiation therapy: Secondary | ICD-10-CM | POA: Diagnosis not present

## 2020-12-28 DIAGNOSIS — C61 Malignant neoplasm of prostate: Secondary | ICD-10-CM | POA: Diagnosis not present

## 2020-12-29 DIAGNOSIS — C61 Malignant neoplasm of prostate: Secondary | ICD-10-CM | POA: Diagnosis not present

## 2020-12-29 DIAGNOSIS — Z510 Encounter for antineoplastic radiation therapy: Secondary | ICD-10-CM | POA: Diagnosis not present

## 2020-12-30 DIAGNOSIS — C61 Malignant neoplasm of prostate: Secondary | ICD-10-CM | POA: Diagnosis not present

## 2020-12-30 DIAGNOSIS — Z510 Encounter for antineoplastic radiation therapy: Secondary | ICD-10-CM | POA: Diagnosis not present

## 2021-01-02 DIAGNOSIS — Z510 Encounter for antineoplastic radiation therapy: Secondary | ICD-10-CM | POA: Diagnosis not present

## 2021-01-02 DIAGNOSIS — C61 Malignant neoplasm of prostate: Secondary | ICD-10-CM | POA: Diagnosis not present

## 2021-01-03 DIAGNOSIS — C61 Malignant neoplasm of prostate: Secondary | ICD-10-CM | POA: Diagnosis not present

## 2021-01-03 DIAGNOSIS — Z510 Encounter for antineoplastic radiation therapy: Secondary | ICD-10-CM | POA: Diagnosis not present

## 2021-01-04 DIAGNOSIS — C61 Malignant neoplasm of prostate: Secondary | ICD-10-CM | POA: Diagnosis not present

## 2021-01-04 DIAGNOSIS — Z510 Encounter for antineoplastic radiation therapy: Secondary | ICD-10-CM | POA: Diagnosis not present

## 2021-01-05 ENCOUNTER — Other Ambulatory Visit: Payer: Self-pay | Admitting: Student

## 2021-01-05 DIAGNOSIS — Z510 Encounter for antineoplastic radiation therapy: Secondary | ICD-10-CM | POA: Diagnosis not present

## 2021-01-05 DIAGNOSIS — C61 Malignant neoplasm of prostate: Secondary | ICD-10-CM | POA: Diagnosis not present

## 2021-01-06 DIAGNOSIS — Z510 Encounter for antineoplastic radiation therapy: Secondary | ICD-10-CM | POA: Diagnosis not present

## 2021-01-06 DIAGNOSIS — C61 Malignant neoplasm of prostate: Secondary | ICD-10-CM | POA: Diagnosis not present

## 2021-01-07 DIAGNOSIS — G4733 Obstructive sleep apnea (adult) (pediatric): Secondary | ICD-10-CM | POA: Diagnosis not present

## 2021-01-09 DIAGNOSIS — C61 Malignant neoplasm of prostate: Secondary | ICD-10-CM | POA: Diagnosis not present

## 2021-01-09 DIAGNOSIS — Z510 Encounter for antineoplastic radiation therapy: Secondary | ICD-10-CM | POA: Diagnosis not present

## 2021-01-10 DIAGNOSIS — C61 Malignant neoplasm of prostate: Secondary | ICD-10-CM | POA: Diagnosis not present

## 2021-01-10 DIAGNOSIS — Z510 Encounter for antineoplastic radiation therapy: Secondary | ICD-10-CM | POA: Diagnosis not present

## 2021-01-12 DIAGNOSIS — I10 Essential (primary) hypertension: Secondary | ICD-10-CM | POA: Diagnosis not present

## 2021-01-12 DIAGNOSIS — I480 Paroxysmal atrial fibrillation: Secondary | ICD-10-CM | POA: Diagnosis not present

## 2021-01-12 DIAGNOSIS — I483 Typical atrial flutter: Secondary | ICD-10-CM | POA: Diagnosis not present

## 2021-01-12 DIAGNOSIS — E7849 Other hyperlipidemia: Secondary | ICD-10-CM | POA: Diagnosis not present

## 2021-01-20 ENCOUNTER — Ambulatory Visit
Admission: EM | Admit: 2021-01-20 | Discharge: 2021-01-20 | Disposition: A | Discharge status: Home / Self Care | Payer: Medicare HMO | Attending: Emergency Medicine | Admitting: Emergency Medicine

## 2021-01-20 ENCOUNTER — Encounter: Payer: Self-pay | Admitting: Emergency Medicine

## 2021-01-20 ENCOUNTER — Other Ambulatory Visit: Payer: Self-pay

## 2021-01-20 DIAGNOSIS — Z23 Encounter for immunization: Secondary | ICD-10-CM

## 2021-01-20 DIAGNOSIS — S61210A Laceration without foreign body of right index finger without damage to nail, initial encounter: Secondary | ICD-10-CM

## 2021-01-20 MED ORDER — TETANUS-DIPHTH-ACELL PERTUSSIS 5-2.5-18.5 LF-MCG/0.5 IM SUSY
0.5000 mL | PREFILLED_SYRINGE | Freq: Once | INTRAMUSCULAR | Status: AC
  Administered 2021-01-20: 0.5 mL via INTRAMUSCULAR

## 2021-01-20 NOTE — ED Triage Notes (Signed)
Pt presents today with c/o of laceration to right fifth finger. He reports scraping it on a stainless steel butter dish this morning. He is on anti-coagulant causing bleeding. Last tetanus unknown.

## 2021-01-20 NOTE — Discharge Instructions (Addendum)
Keep wound clean and dry Gently cleanse area, avoid further irritation to the area is much as possible Tetanus updated today Follow-up for any signs of infection

## 2021-01-20 NOTE — ED Provider Notes (Signed)
[facility]    CSN: [id number] Arrival date & time: 01/20/21  1021      History   Chief Complaint Chief Complaint  Patient presents with   Laceration     HPI Gregory Odonnell is a 75 y.o. male history of GI bleeding, hypertension, paroxysmal A. fib on Eliquis presenting today for evaluation of right finger injury.  Reports that he was attempting to use a butter knife and believes he excellently cut his finger on something, but unsure exactly what.  Since he has had continued bleeding over the past hour.  Unsure of last tetanus.  Denies any pain or difficulty bending finger.  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   Seasonal asthma    "sometimes in May"    Patient Active Problem List   Diagnosis Date Noted   Mixed hyperlipidemia 05/11/2020   Paroxysmal atrial fibrillation ([place]) 04/06/2020   Lower GI bleeding 04/15/2018   Rectal bleed 04/12/2018   Protein-calorie malnutrition ([place]) 11/25/2015   Acute lower GI bleeding    GI bleed 11/24/2015   Essential hypertension 11/24/2015   History of prostate cancer 11/24/2015   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 Authorizing Provider  amLODipine (NORVASC) 10 MG tablet Take 10 mg by mouth daily. 02/22/14   [provider]  apixaban (ELIQUIS) 5 MG TABS tablet Take 1 tablet (5  mg total) by mouth 2 (two) times daily. 09/23/20   Cantwell, Celeste C, PA-C  BREO ELLIPTA 100-25 MCG/INH AEPB as needed. 01/08/20   [provider]  furosemide (LASIX) 20 MG tablet Take 1 tablet (20 mg total) by mouth daily as needed for fluid (swelling or shortness of breath). 09/23/20 01/21/21  Cantwell, Celeste C, PA-C  hydrochlorothiazide (MICROZIDE) 12.5 MG capsule TAKE ONE CAPSULE BY MOUTH ONE TIME DAILY 01/05/21   Cantwell, Celeste C, PA-C  irbesartan-hydrochlorothiazide (AVALIDE) 300-12.5 MG tablet Take 1 tablet by mouth daily.    [provider]  nebivolol (BYSTOLIC) 10 MG tablet Take 1 tablet by mouth daily. 04/21/19   [provider]  rosuvastatin (CRESTOR) 10 MG tablet Take 1 tablet by mouth daily. 04/27/20   [provider]    Family History Family History  Problem Relation Age of Onset   Cancer Father        prostate   Cancer Brother     Social History Social History   Tobacco Use   Smoking status: Never   Smokeless tobacco: Never  Vaping Use   Vaping Use: Never used  Substance Use Topics   Alcohol use: Yes    Comment: 11/24/2015 "I drink once q 6-7 months"   Drug use: Not Currently  Types: Marijuana    Comment: "back in the 1970s only"     Allergies   Patient has no known allergies.   Review of Systems Review of Systems  Constitutional:  Negative for fatigue and fever.  Eyes:  Negative for redness, itching and visual disturbance.  Respiratory:  Negative for shortness of breath.   Cardiovascular:  Negative for chest pain and leg swelling.  Gastrointestinal:  Negative for nausea and vomiting.  Musculoskeletal:  Negative for arthralgias and myalgias.  Skin:  Positive for wound. Negative for color change and rash.  Neurological:  Negative for dizziness, syncope, weakness, light-headedness and headaches.    Physical Exam Triage Vital Signs ED Triage Vitals  Enc Vitals Group     BP      Pulse      Resp      Temp       Temp src      SpO2      Weight      Height      Head Circumference      Peak Flow      Pain Score      Pain Loc      Pain Edu?      Excl. in [place]?    No data found.  Updated Vital Signs BP (!) 144/80 (BP Location: Left Arm)   Pulse 75   Temp 98 F (36.7 C) (Oral)   Resp 18   SpO2 98%   Visual Acuity Right Eye Distance:   Left Eye Distance:   Bilateral Distance:    Right Eye Near:   Left Eye Near:    Bilateral Near:     Physical Exam Vitals and nursing note reviewed.  Constitutional:      Appearance: He is well-developed.     Comments: No acute distress  HENT:     Head: Normocephalic and atraumatic.     Nose: Nose normal.  Eyes:     Conjunctiva/sclera: Conjunctivae normal.  Cardiovascular:     Rate and Rhythm: Normal rate.  Pulmonary:     Effort: Pulmonary effort is normal. No respiratory distress.  Abdominal:     General: There is no distension.  Musculoskeletal:        General: Normal range of motion.     Cervical back: Neck supple.     Comments: Full active range of motion of right index finger  Skin:    General: Skin is warm and dry.     Comments: Right index finger with relatively superficial wound slightly oozing blood  Neurological:     Mental Status: He is alert and oriented to person, place, and time.     UC Treatments / Results  Labs (all labs ordered are listed, but only abnormal results are displayed) Labs Reviewed - No data to display  EKG   Radiology No results found.  Procedures Laceration Repair  Date/Time: 01/20/2021 11:22 AM Performed by: Kenyatte Gruber, Beaman C, PA-C Authorized by: Alioune Hodgkin, Elesa Hacker, PA-C   Consent:    Consent obtained:  Verbal   Consent given by:  Patient   Risks discussed:  Infection, pain and poor cosmetic result   Alternatives discussed:  No treatment Universal protocol:    Patient identity confirmed:  Verbally with patient Anesthesia:    Anesthesia method:  None Laceration details:    Location:   Finger   Finger location:  R index finger   Length (cm):  1.5 Exploration:    Hemostasis achieved with:  Direct pressure  and tourniquet   Imaging outcome: foreign body not noted     Wound exploration: wound explored through full range of motion     Contaminated: no   Skin repair:    Repair method:  Tissue adhesive Approximation:    Approximation:  Loose Repair type:    Repair type:  Simple Post-procedure details:    Dressing:  Non-adherent dressing   Procedure completion:  Tolerated well, no immediate complications (including critical care time)  Medications Ordered in UC Medications  Tdap (BOOSTRIX) injection 0.5 mL (0.5 mLs Intramuscular Given 01/20/21 1057)    Initial Impression / Assessment and Plan / UC Course  I have reviewed the triage vital signs and the nursing notes.  Pertinent labs & imaging results that were available during my care of the patient were reviewed by me and considered in my medical decision making (see chart for details).     Right index finger laceration, minimally deep, but actively bleeding due to patient being on Eliquis, applied tourniquet with glove to base of right index finger and applied pressure dressing, elevated overhead for approximately 20 minutes, bleeding did subside, Dermabond applied with repeat nonadherent/pressure dressing afterwards.  Discussed wound care and monitoring for gradual healing.  Low suspicion of underlying fracture or tendon injury.  Discussed strict return precautions. Patient verbalized understanding and is agreeable with plan.  Final Clinical Impressions(s) / UC Diagnoses   Final diagnoses:  Laceration of right index finger without foreign body without damage to nail, initial encounter     Discharge Instructions      Keep wound clean and dry Gently cleanse area, avoid further irritation to the area is much as possible Tetanus updated today Follow-up for any signs of infection     ED Prescriptions    None    PDMP not reviewed this encounter.   Janith Lima, PA-C 01/20/21 1124

## 2021-02-06 DIAGNOSIS — G4733 Obstructive sleep apnea (adult) (pediatric): Secondary | ICD-10-CM | POA: Diagnosis not present

## 2021-02-09 DIAGNOSIS — G4733 Obstructive sleep apnea (adult) (pediatric): Secondary | ICD-10-CM | POA: Diagnosis not present

## 2021-02-22 DIAGNOSIS — R739 Hyperglycemia, unspecified: Secondary | ICD-10-CM | POA: Diagnosis not present

## 2021-02-22 DIAGNOSIS — E782 Mixed hyperlipidemia: Secondary | ICD-10-CM | POA: Diagnosis not present

## 2021-02-22 DIAGNOSIS — D519 Vitamin B12 deficiency anemia, unspecified: Secondary | ICD-10-CM | POA: Diagnosis not present

## 2021-02-22 DIAGNOSIS — R609 Edema, unspecified: Secondary | ICD-10-CM | POA: Diagnosis not present

## 2021-02-22 DIAGNOSIS — I10 Essential (primary) hypertension: Secondary | ICD-10-CM | POA: Diagnosis not present

## 2021-02-22 DIAGNOSIS — Z6834 Body mass index (BMI) 34.0-34.9, adult: Secondary | ICD-10-CM | POA: Diagnosis not present

## 2021-02-22 DIAGNOSIS — I483 Typical atrial flutter: Secondary | ICD-10-CM | POA: Diagnosis not present

## 2021-02-22 DIAGNOSIS — D649 Anemia, unspecified: Secondary | ICD-10-CM | POA: Diagnosis not present

## 2021-03-09 DIAGNOSIS — G4733 Obstructive sleep apnea (adult) (pediatric): Secondary | ICD-10-CM | POA: Diagnosis not present

## 2021-03-14 ENCOUNTER — Telehealth: Payer: Self-pay

## 2021-03-14 DIAGNOSIS — Z6835 Body mass index (BMI) 35.0-35.9, adult: Secondary | ICD-10-CM | POA: Diagnosis not present

## 2021-03-14 DIAGNOSIS — E11649 Type 2 diabetes mellitus with hypoglycemia without coma: Secondary | ICD-10-CM | POA: Diagnosis not present

## 2021-03-14 DIAGNOSIS — I10 Essential (primary) hypertension: Secondary | ICD-10-CM | POA: Diagnosis not present

## 2021-03-14 DIAGNOSIS — R609 Edema, unspecified: Secondary | ICD-10-CM | POA: Diagnosis not present

## 2021-03-14 DIAGNOSIS — D649 Anemia, unspecified: Secondary | ICD-10-CM | POA: Diagnosis not present

## 2021-03-14 NOTE — Telephone Encounter (Signed)
Called and lvm to see if pt received CPAP and if he's been using last 31 days. If pt has not, we will need to CX and r/s appt.

## 2021-03-15 ENCOUNTER — Encounter: Payer: Self-pay | Admitting: Family Medicine

## 2021-03-15 ENCOUNTER — Other Ambulatory Visit: Payer: Self-pay

## 2021-03-15 ENCOUNTER — Ambulatory Visit (INDEPENDENT_AMBULATORY_CARE_PROVIDER_SITE_OTHER): Payer: Medicare HMO | Admitting: Family Medicine

## 2021-03-15 VITALS — BP 155/81 | HR 76 | Ht 73.0 in | Wt 250.0 lb

## 2021-03-15 DIAGNOSIS — Z9989 Dependence on other enabling machines and devices: Secondary | ICD-10-CM | POA: Diagnosis not present

## 2021-03-15 DIAGNOSIS — G4733 Obstructive sleep apnea (adult) (pediatric): Secondary | ICD-10-CM

## 2021-03-15 NOTE — Patient Instructions (Addendum)
Please continue using your CPAP regularly. While your insurance requires that you use CPAP at least 4 hours each night on 70% of the nights, I recommend, that you not skip any nights and use it throughout the night if you can. Getting used to CPAP and staying with the treatment long term does take time and patience and discipline. Untreated obstructive sleep apnea when it is moderate to severe can have an adverse impact on cardiovascular health and raise her risk for heart disease, arrhythmias, hypertension, congestive heart failure, stroke and diabetes. Untreated obstructive sleep apnea causes sleep disruption, nonrestorative sleep, and sleep deprivation. This can have an impact on your day to day functioning and cause daytime sleepiness and impairment of cognitive function, memory loss, mood disturbance, and problems focussing. Using CPAP regularly can improve these symptoms.  Please continue to monitor for leak. If you see it is not improving please let me know and we can send you for a mask refitting.   Follow up in 1 year

## 2021-03-15 NOTE — Progress Notes (Addendum)
PATIENT: Gregory Odonnell DOB: 09/12/1945  REASON FOR VISIT: follow up HISTORY FROM: patient  Chief Complaint  Patient presents with   Obstructive Sleep Apnea    New rm, alone, states he is doing well on cpap, c/o mask leaks      HISTORY OF PRESENT ILLNESS: 03/15/21 ALL:  Gregory Odonnell is a 75 y.o. male here today for follow up for OSA on CPAP.  HST 06/27/2020 showed moderate OSA with total AHI of 18.1/hr and O2 nadir of 89%. AutoPAP was ordered. He has used CPAP daily since being set up in 12/2020. He does feel that he is sleeping better. He feels more energized and better rested. He denies concerns with machine. He has noted a leak with his nasal mask. He is using a medium nasal mask and feels it is comfortable and fits well. He sleeps on his side and thinks leak if from mask getting shifted. He is monitoring at home and adjusting headgear.     HISTORY: (copied from Dr Guadelupe Sabin previous note)  Dear Dr. Virgina Jock,    I saw your patient, Gregory Odonnell, upon your kind request in my sleep clinic today for initial consultation of his sleep disorder, in particular, concern for underlying obstructive sleep apnea.  The patient is unaccompanied today.  As you know, Mr. Lasswell is a 75 year old right-handed gentleman with an underlying medical history of paroxysmal A. fib, prostate cancer, hypertension, diverticulosis, arthritis, seasonal asthma, and mild obesity, who reports snoring and some difficulty with sleep initiation and sleep maintenance.  He was diagnosed with paroxysmal A. fib not too long ago, it was in the context of preparing for cataract surgery.  He does not have any significant symptoms such as shortness of breath or palpitations or chest pains.  Years ago his wife had voiced concern that he may have pauses in his breathing.  She has not complained about it lately.  They have been married 19 years.  They have 1 grown daughter, age 105 and 83 grandson, age 71, 42  grandchildren.  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  He is a non-smoker and drinks no alcohol, limited caffeine in the form of coffee, 2 cups/day on average.  He typically goes to bed around 10 or 11.  He denies recurrent morning headaches but has nocturia about twice per average night, no obvious or known family history of sleep apnea.  His snoring can be loud and disturbing to his wife but will fluctuate. I reviewed your office note from 05/11/2020.  His Epworth sleepiness score is 2 out of 24, fatigue severity score is 11 out of 63.   REVIEW OF SYSTEMS: Out of a complete 14 system review of symptoms, the patient complains only of the following symptoms, none and all other reviewed systems are negative.   ALLERGIES: No Known Allergies  HOME MEDICATIONS: Outpatient Medications Prior to Visit  Medication Sig Dispense Refill   amLODipine (NORVASC) 10 MG tablet Take 10 mg by mouth daily.     apixaban (ELIQUIS) 5 MG TABS tablet Take 1 tablet (5 mg total) by mouth 2 (two) times daily. 180 tablet 3   BREO ELLIPTA 100-25 MCG/INH AEPB as needed.     hydrochlorothiazide (MICROZIDE) 12.5 MG capsule TAKE ONE CAPSULE BY MOUTH ONE TIME DAILY 30 capsule 6   irbesartan-hydrochlorothiazide (AVALIDE) 300-12.5 MG tablet Take 1 tablet by mouth daily.     nebivolol (BYSTOLIC) 10 MG tablet Take 1 tablet by mouth daily.  rosuvastatin (CRESTOR) 10 MG tablet Take 1 tablet by mouth daily.     furosemide (LASIX) 20 MG tablet Take 1 tablet (20 mg total) by mouth daily as needed for fluid (swelling or shortness of breath). 30 tablet 3   No facility-administered medications prior to visit.    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    Seasonal asthma    "sometimes in May"    PAST SURGICAL HISTORY: Past Surgical History:  Procedure Laterality Date   [place]   "herniated disc"   PROSTATECTOMY  2011   TONSILLECTOMY  1950s    FAMILY HISTORY: Family History  Problem Relation Age of Onset   Cancer Father        prostate   Cancer Brother     SOCIAL HISTORY: Social History   Socioeconomic History   Marital status: Married    Spouse name: Not on file   Number of children: 2   Years of education: Not on file   Highest education level: Not on file  Occupational History   Occupation: retired  Tobacco Use   Smoking status: Never   Smokeless tobacco: Never  Vaping Use   Vaping Use: Never used  Substance and Sexual Activity   Alcohol use: Yes    Comment: 11/24/2015 "I drink once q 6-7 months"   Drug use: Not Currently    Types: Marijuana    Comment: "back in the 1970s only"   Sexual activity: Not Currently  Other Topics Concern   Not on file  Social History Narrative   Not on file   Social Determinants of Health   Financial Resource Strain: Not on file  Food Insecurity: Not on file  Transportation Needs: Not on file  Physical Activity: Not on file  Stress: Not on file  Social Connections: Not on file  Intimate Partner Violence: Not on file     PHYSICAL EXAM  Vitals:   03/15/21 1535  BP: (!) 155/81  Pulse: 76  Weight: 250 lb (113.4 kg)  Height: '6\' 1"'$  (1.854 m)   Body mass index is 32.98 kg/m.  Generalized: Well developed, in no acute distress  Cardiology: normal rate and rhythm, no murmur noted Respiratory: clear to auscultation bilaterally  Neurological examination  Mentation: Alert oriented to time, place, history taking. Follows all commands speech and language fluent Cranial nerve II-XII: Pupils were equal round reactive to light. Extraocular movements were full, visual field were full  Motor: The motor testing reveals 5 over  5 strength of all 4 extremities. Good symmetric motor tone is noted throughout.  Gait and station: Gait is normal.    DIAGNOSTIC DATA (LABS, IMAGING, TESTING) - I reviewed patient records, labs, notes, testing and imaging myself where available.  No flowsheet data found.   Lab Results  Component Value Date   WBC 6.2 04/16/2018   HGB 11.1 (L) 04/16/2018   HCT 36.5 (L) 04/16/2018   MCV 63.9 (L) 04/16/2018   PLT 229 04/16/2018      Component Value Date/Time   NA 138 09/02/2020 0917   K 4.2 09/02/2020 [id number]  CL 104 09/02/2020 0917   CO2 18 (L) 09/02/2020 0917   GLUCOSE 105 (H) 09/02/2020 0917   GLUCOSE 103 (H) 04/16/2018 0601   BUN 14 09/02/2020 0917   CREATININE 1.11 09/02/2020 0917   CALCIUM 9.4 09/02/2020 0917   PROT 6.5 04/15/2018 0807   ALBUMIN 3.5 04/15/2018 0807   AST 27 04/15/2018 0807   ALT 22 04/15/2018 0807   ALKPHOS 43 04/15/2018 0807   BILITOT 0.7 04/15/2018 0807   GFRNONAA 65 09/02/2020 0917   GFRAA 75 09/02/2020 0917   No results found for: CHOL, HDL, LDLCALC, LDLDIRECT, TRIG, CHOLHDL No results found for: HGBA1C No results found for: VITAMINB12 No results found for: TSH   ASSESSMENT AND PLAN 75 y.o. year old male  has a past medical history of Arthritis, Diverticulosis, Hypertension, Pancreatic lesion, Prostate cancer ([place]), and Seasonal asthma. here with     ICD-10-CM   1. Obstructive sleep apnea on CPAP  G47.33    Z99.89        DEVERN PITT is doing well on CPAP therapy. Compliance report reveals excellent compliance. He was encouraged to continue using CPAP nightly and for greater than 4 hours each night. He will continue to monitor for leak at home. He will call if leak continues and we will send him for a mask refitting. Risks of untreated sleep apnea review and education materials provided. Healthy lifestyle habits encouraged. He will follow up in 1 year, sooner if needed. He verbalizes understanding and agreement with this plan.    No  orders of the defined types were placed in this encounter.    No orders of the defined types were placed in this encounter.     Debbora Presto, FNP-C 03/15/2021, 4:13 PM [facility] [address] [place], [place] [address] [phone]  I reviewed the above note and documentation by the Nurse Practitioner and agree with the history, exam, assessment and plan as outlined above. I was available for consultation. Star Age, MD, PhD [facility])

## 2021-03-27 ENCOUNTER — Other Ambulatory Visit: Payer: Self-pay

## 2021-03-27 ENCOUNTER — Encounter: Payer: Self-pay | Admitting: Student

## 2021-03-27 ENCOUNTER — Ambulatory Visit: Payer: Medicare HMO | Admitting: Student

## 2021-03-27 VITALS — BP 132/82 | HR 65 | Temp 97.9°F | Ht 73.0 in | Wt 247.0 lb

## 2021-03-27 DIAGNOSIS — I10 Essential (primary) hypertension: Secondary | ICD-10-CM | POA: Diagnosis not present

## 2021-03-27 DIAGNOSIS — R600 Localized edema: Secondary | ICD-10-CM | POA: Diagnosis not present

## 2021-03-27 DIAGNOSIS — I480 Paroxysmal atrial fibrillation: Secondary | ICD-10-CM

## 2021-03-27 DIAGNOSIS — E782 Mixed hyperlipidemia: Secondary | ICD-10-CM | POA: Diagnosis not present

## 2021-03-27 NOTE — Progress Notes (Signed)
Patient referred by [name], MD for atrial fibrillation  Subjective:   Gregory Odonnell, male    DOB: 1945-12-08, 75 y.o.   MRN: [id number]   Chief Complaint  Patient presents with   Hypertension   Follow-up   Edema   Atrial Fibrillation     HPI  75 y.o. African-American male with hypertension, hyperlipidemia, paroxysmal atrial fibrillation, obstructive sleep apnea on CPAP.  History of prostate cancer.  Patient presents for 75-monthfollow-up of hypertension, paroxysmal atrial fibrillation, and hyperlipidemia.  At last office visit started furosemide 20 mg daily as needed. Patient states overall he has been feeling well. Blood pressure, lipids, and A1c have been well controlled. Patient is asymptotic with episodes of atrial fibrillation, however, he does note that home blood pressure cuff occasionally notes "irregular rhythm".   Patient leg edema has significantly improved since last visit, however, he has been taking Lasix 40 mg daily for the last 2 weeks due to leg edema. He is now compliant with CPAP nightly.  Patient denies chest pain, palpitations, dyspnea, syncope, near syncope, PND, orthopnea.  Current Outpatient Medications on File Prior to Visit  Medication Sig Dispense Refill   amLODipine (NORVASC) 10 MG tablet Take 10 mg by mouth daily.     apixaban (ELIQUIS) 5 MG TABS tablet Take 1 tablet (5 mg total) by mouth 2 (two) times daily. 180 tablet 3   BREO ELLIPTA 100-25 MCG/INH AEPB Inhale 1 puff into the lungs as needed.     furosemide (LASIX) 20 MG tablet Take 1 tablet (20 mg total) by mouth daily as needed for fluid (swelling or shortness of breath). 30 tablet 3   hydrochlorothiazide (MICROZIDE) 12.5 MG capsule TAKE ONE CAPSULE BY MOUTH ONE TIME DAILY 30 capsule 6   irbesartan-hydrochlorothiazide (AVALIDE) 300-12.5 MG tablet Take 1 tablet by mouth daily.     nebivolol (BYSTOLIC) 10 MG tablet Take 1 tablet by mouth daily.     rosuvastatin (CRESTOR) 10 MG tablet  Take 1 tablet by mouth daily.     No current facility-administered medications on file prior to visit.    Cardiovascular and other pertinent studies: EKG 03/27/2021:  Atrial fibrillation with controlled ventricular response at a rate of 69 bpm.  Normal axis.  Right bundle branch block.  Anterolateral T wave inversions, consider ischemia.  Compared to EKG 08/26/2020, no significant change.  EKG 08/26/2020:  Atrial Fibrillation with controlled ventricular response  Right Bundle Branch Block Lateral T wave changes are nonspecific  Echocardiogram 04/12/2020:  Left ventricle cavity is normal in size and wall thickness. Normal global  wall motion. Normal LV systolic function with EF 66%. Normal diastolic  filling pattern.  Left atrial cavity is normal in size.  Moderate (Grade II) mitral regurgitation.  Mild tricuspid regurgitation. Estimated pulmonary artery systolic pressure  39 mmHg.   Lexiscan (Walking with mod Bruce)Tetrofosmin Stress Test  04/11/2020: Nondiagnostic ECG stress. Resting EKG demonstrated normal sinus rhythm. Left QRS axis deviation in the presence of right bundle branch block. Peak EKG revealed no significant ST-T change from baseline abnormality. Myocardial perfusion is normal. Overall LV systolic function is normal without regional wall motion abnormalities. Stress LV EF: 76%.  No previous exam available for comparison. Low risk.   EKG 04/07/2020: Sinus rhythm  Occasional PAC   Right bundle branch block Anterolateral T wave inversion, consider ischemia   EKG 03/16/2020:  Atrial Fibrillation Right Bundle Branch Block Lateral T wave changes are nonspecific   Recent labs: CMP Latest Ref Rng &  Units 09/02/2020 08/26/2020 04/16/2018  Glucose 65 - 99 mg/dL 105(H) 90 103(H)  BUN 8 - 27 mg/dL _0 Creatinine 0.76 - 1.27 mg/dL 1.11 1.13 0.93  Sodium 134 - 144 mmol/L 138 142 137  Potassium 3.5 - 5.2 mmol/L 4.2 4.5 3.7  Chloride 96 - 106 mmol/L 104 106 104  CO2 20 -  29 mmol/L 18(L) 19(L) 25  Calcium 8.6 - 10.2 mg/dL 9.4 9.3 9.1  Total Protein 6.5 - 8.1 g/dL - - -  Total Bilirubin 0.3 - 1.2 mg/dL - - -  Alkaline Phos 38 - 126 U/L - - -  AST 15 - 41 U/L - - -  ALT 0 - 44 U/L - - -   CBC Latest Ref Rng & Units 04/16/2018 04/15/2018 04/15/2018  WBC 4.0 - 10.5 K/uL 6.2 6.2 5.9  Hemoglobin 13.0 - 17.0 g/dL 11.1(L) 11.3(L) 11.0(L)  Hematocrit 39.0 - 52.0 % 36.5(L) 36.3(L) 35.0(L)  Platelets 150 - 400 K/uL 229 238 237   Lipid Panel  No results found for: CHOL, TRIG, HDL, CHOLHDL, VLDL, LDLCALC, LDLDIRECT HEMOGLOBIN A1C No results found for: HGBA1C, MPG TSH No results for input(s): TSH in the last 8760 hours.  External labs:  02/22/2021: HDL 55, LDL 84, total cholesterol 155, triglycerides 69 A1c 5.7% BUN 21, creatinine 1.01, GFR >60  09/02/2020: Glucose 105, BUN 14, creatinine 1.11, GFR 65, sodium 130, potassium 4.2  BNP 436  02/01/2020: Glucose 111, BUN/Cr 15/1.09. EGFR 77. Na/K 139/4.1. Rest of the CMP normal H/H 12.9/43.5. MCV 65.6. Platelets 238 HbA1C 5.6% Chol 206, TG 61, HDL 53, LDL 138   Review of Systems  Cardiovascular:  Positive for leg swelling. Negative for chest pain, claudication, dyspnea on exertion, near-syncope, orthopnea, palpitations, paroxysmal nocturnal dyspnea and syncope.  Respiratory:  Negative for shortness of breath.   Neurological:  Negative for dizziness and weakness.        Vitals:   03/27/21 1405  BP: 132/82  Pulse: 65  Temp: 97.9 F (36.6 C)  SpO2: 98%     Body mass index is 32.59 kg/m. Filed Weights   03/27/21 1405  Weight: 247 lb (112 kg)     Objective:   Physical Exam Vitals and nursing note reviewed.  Constitutional:      General: He is not in acute distress. Neck:     Vascular: No carotid bruit or JVD.  Cardiovascular:     Rate and Rhythm: Normal rate and regular rhythm.     Pulses: Intact distal pulses.     Heart sounds: Normal heart sounds, S1 normal and S2 normal. No murmur  heard.   No gallop.  Pulmonary:     Effort: Pulmonary effort is normal. No respiratory distress.     Breath sounds: Normal breath sounds. No wheezing, rhonchi or rales.  Musculoskeletal:     Right lower leg: Edema (minimal) present.     Left lower leg: Edema (minimal) present.  Skin:    General: Skin is warm and dry.  Neurological:     General: No focal deficit present.     Mental Status: He is alert and oriented to person, place, and time.         Assessment & Recommendations:   75 y.o. African-American male with with hypertension, hyperlipidemia, paroxysmal atrial fibrillation, obstructive sleep apnea on CPAP.  History of prostate cancer.  Bilateral leg edema:  Patient's leg edema has improved since last visit, however he has been taking Lasix daily for the last 2  weeks.  Will obtain echocardiogram Will obtain repeat BNP and BMP   Paroxysmal atrial fibrillation: He is asymptomatic from A. fib standpoint.  Continue rate control management.  Currently on Bystolic 10 mg daily. CHA2DS2Vasc score 2, annual stroke risk 2.2%.  His annual stroke risk will only increase as age progresses. Continue eliquis 5 mg bid.  Structurally normal heart, no severe valvular abnormalities. No isehmia on stress testing. However, patient has been in atrial fibrillation at last 2 office visits.  Will obtain repeat echocardiogram.   Orthopnea:  Orthopnea has resolved with nightly use of CPAP.  Encouraged patient to continue CPAP compliance.   Hypertension:  Blood pressure is well controlled. Continue current medication.   Hyperlipidemia: I personally reviewed external labs, lipids are well controlled.  Continue Crestor 10 mg.   If no significant abnormality on echocardiogram, follow-up in 6 months, sooner if needed, for hypertension, paroxysmal atrial fibrillation, hyperlipidemia.    Alethia Berthold, PA-C 03/27/2021, 2:55 PM Office: [phone]

## 2021-03-28 LAB — BASIC METABOLIC PANEL
BUN/Creatinine Ratio: 21 (ref 10–24)
BUN: 26 mg/dL (ref 8–27)
CO2: 21 mmol/L (ref 20–29)
Calcium: 9.9 mg/dL (ref 8.6–10.2)
Chloride: 103 mmol/L (ref 96–106)
Creatinine, Ser: 1.24 mg/dL (ref 0.76–1.27)
Glucose: 98 mg/dL (ref 65–99)
Potassium: 4.4 mmol/L (ref 3.5–5.2)
Sodium: 140 mmol/L (ref 134–144)
eGFR: 61 mL/min/{1.73_m2} (ref >59)

## 2021-03-28 LAB — BRAIN NATRIURETIC PEPTIDE: BNP: 361.8 pg/mL — ABNORMAL HIGH (ref 0.0–100.0)

## 2021-03-30 NOTE — Progress Notes (Signed)
Called and spoke with patient regarding his lab results. Patient advised to continue Lasix.

## 2021-04-04 DIAGNOSIS — C61 Malignant neoplasm of prostate: Secondary | ICD-10-CM | POA: Diagnosis not present

## 2021-04-06 ENCOUNTER — Ambulatory Visit: Payer: Medicare HMO

## 2021-04-06 ENCOUNTER — Other Ambulatory Visit: Payer: Self-pay

## 2021-04-06 DIAGNOSIS — R600 Localized edema: Secondary | ICD-10-CM | POA: Diagnosis not present

## 2021-04-06 DIAGNOSIS — I10 Essential (primary) hypertension: Secondary | ICD-10-CM

## 2021-04-06 DIAGNOSIS — I480 Paroxysmal atrial fibrillation: Secondary | ICD-10-CM | POA: Diagnosis not present

## 2021-04-09 DIAGNOSIS — G4733 Obstructive sleep apnea (adult) (pediatric): Secondary | ICD-10-CM | POA: Diagnosis not present

## 2021-04-10 ENCOUNTER — Other Ambulatory Visit: Payer: Self-pay | Admitting: Student

## 2021-04-10 DIAGNOSIS — I4819 Other persistent atrial fibrillation: Secondary | ICD-10-CM | POA: Diagnosis not present

## 2021-04-10 DIAGNOSIS — K625 Hemorrhage of anus and rectum: Secondary | ICD-10-CM | POA: Diagnosis not present

## 2021-04-10 DIAGNOSIS — K5730 Diverticulosis of large intestine without perforation or abscess without bleeding: Secondary | ICD-10-CM | POA: Diagnosis not present

## 2021-04-10 NOTE — Progress Notes (Signed)
Called and spoke with patient regarding his echocardiogram results.  ?

## 2021-04-14 DIAGNOSIS — I483 Typical atrial flutter: Secondary | ICD-10-CM | POA: Diagnosis not present

## 2021-04-14 DIAGNOSIS — E7849 Other hyperlipidemia: Secondary | ICD-10-CM | POA: Diagnosis not present

## 2021-04-14 DIAGNOSIS — I10 Essential (primary) hypertension: Secondary | ICD-10-CM | POA: Diagnosis not present

## 2021-04-14 DIAGNOSIS — I480 Paroxysmal atrial fibrillation: Secondary | ICD-10-CM | POA: Diagnosis not present

## 2021-04-25 DIAGNOSIS — K635 Polyp of colon: Secondary | ICD-10-CM | POA: Diagnosis not present

## 2021-04-25 DIAGNOSIS — K625 Hemorrhage of anus and rectum: Secondary | ICD-10-CM | POA: Diagnosis not present

## 2021-04-25 DIAGNOSIS — K921 Melena: Secondary | ICD-10-CM | POA: Diagnosis not present

## 2021-04-25 DIAGNOSIS — K5140 Inflammatory polyps of colon without complications: Secondary | ICD-10-CM | POA: Diagnosis not present

## 2021-04-25 DIAGNOSIS — K5730 Diverticulosis of large intestine without perforation or abscess without bleeding: Secondary | ICD-10-CM | POA: Diagnosis not present

## 2021-05-03 DIAGNOSIS — Z23 Encounter for immunization: Secondary | ICD-10-CM | POA: Diagnosis not present

## 2021-05-03 DIAGNOSIS — R7301 Impaired fasting glucose: Secondary | ICD-10-CM | POA: Diagnosis not present

## 2021-05-03 DIAGNOSIS — K862 Cyst of pancreas: Secondary | ICD-10-CM | POA: Diagnosis not present

## 2021-05-09 DIAGNOSIS — G4733 Obstructive sleep apnea (adult) (pediatric): Secondary | ICD-10-CM | POA: Diagnosis not present

## 2021-05-10 DIAGNOSIS — I10 Essential (primary) hypertension: Secondary | ICD-10-CM | POA: Diagnosis not present

## 2021-05-15 DIAGNOSIS — E7849 Other hyperlipidemia: Secondary | ICD-10-CM | POA: Diagnosis not present

## 2021-05-15 DIAGNOSIS — I480 Paroxysmal atrial fibrillation: Secondary | ICD-10-CM | POA: Diagnosis not present

## 2021-05-15 DIAGNOSIS — I10 Essential (primary) hypertension: Secondary | ICD-10-CM | POA: Diagnosis not present

## 2021-08-10 DIAGNOSIS — E7800 Pure hypercholesterolemia, unspecified: Secondary | ICD-10-CM | POA: Diagnosis not present

## 2021-08-10 DIAGNOSIS — I4891 Unspecified atrial fibrillation: Secondary | ICD-10-CM | POA: Diagnosis not present

## 2021-08-10 DIAGNOSIS — E1169 Type 2 diabetes mellitus with other specified complication: Secondary | ICD-10-CM | POA: Diagnosis not present

## 2021-08-10 DIAGNOSIS — E781 Pure hyperglyceridemia: Secondary | ICD-10-CM | POA: Diagnosis not present

## 2021-08-10 DIAGNOSIS — I10 Essential (primary) hypertension: Secondary | ICD-10-CM | POA: Diagnosis not present

## 2021-08-11 ENCOUNTER — Other Ambulatory Visit: Payer: Self-pay | Admitting: Student

## 2021-08-14 ENCOUNTER — Other Ambulatory Visit: Payer: Self-pay

## 2021-08-14 MED ORDER — HYDROCHLOROTHIAZIDE 12.5 MG PO CAPS: 12.5000 mg | ORAL_CAPSULE | Freq: Every day | ORAL | 0 refills | Status: DC

## 2021-08-28 DIAGNOSIS — J399 Disease of upper respiratory tract, unspecified: Secondary | ICD-10-CM | POA: Diagnosis not present

## 2021-08-29 DIAGNOSIS — Z08 Encounter for follow-up examination after completed treatment for malignant neoplasm: Secondary | ICD-10-CM | POA: Diagnosis not present

## 2021-08-29 DIAGNOSIS — Z8546 Personal history of malignant neoplasm of prostate: Secondary | ICD-10-CM | POA: Diagnosis not present

## 2021-08-29 DIAGNOSIS — Z8551 Personal history of malignant neoplasm of bladder: Secondary | ICD-10-CM | POA: Diagnosis not present

## 2021-09-21 NOTE — Progress Notes (Signed)
Patient referred by [name], MD for atrial fibrillation  Subjective:   Gregory Odonnell, male    DOB: [birth year], 76 y.o.   MRN: [id number]   No chief complaint on file.    HPI  76 y.o. African-American male with hypertension, hyperlipidemia, paroxysmal atrial fibrillation, obstructive sleep apnea on CPAP.  History of prostate cancer.  Patient presents for 70-monthfollow-up.  At last office visit ordered labs and echocardiogram. BMP remained stable and BNP had improved compared to previous.  Echocardiogram revealed normal LVEF with severely dilated LA and mild valvular disease as well as mild pulmonary hypertension, unchanged compared to 03/2020.  Patient continues to do well from a cardiac standpoint and remains asymptomatic.  Denies chest pain, dyspnea, dizziness, palpitations.  He has mild intermittent bilateral leg edema, but he wears compression stockings and this improves.  He notes that home blood pressure cuff continues to occasionally note "irregular rhythm".  Continues to tolerate anticoagulation without bleeding diathesis.  He reports home blood pressure readings which are well controlled.  Current Outpatient Medications on File Prior to Visit  Medication Sig Dispense Refill   amLODipine (NORVASC) 10 MG tablet Take 10 mg by mouth daily.     apixaban (ELIQUIS) 5 MG TABS tablet Take 1 tablet (5 mg total) by mouth 2 (two) times daily. 180 tablet 3   BREO ELLIPTA 100-25 MCG/INH AEPB Inhale 1 puff into the lungs as needed.     furosemide (LASIX) 20 MG tablet Take 1 tablet by mouth daily as needed for fluid (swelling or shortness of breath) 30 tablet 0   hydrochlorothiazide (MICROZIDE) 12.5 MG capsule Take 1 capsule (12.5 mg total) by mouth daily. 90 capsule 0   irbesartan-hydrochlorothiazide (AVALIDE) 300-12.5 MG tablet Take 1 tablet by mouth daily.     nebivolol (BYSTOLIC) 10 MG tablet Take 1 tablet by mouth daily.     NOREL AD 4-10-325 MG TABS Take 1 tablet by mouth 4  (four) times daily.     rosuvastatin (CRESTOR) 10 MG tablet Take 1 tablet by mouth daily.     No current facility-administered medications on file prior to visit.    Cardiovascular and other pertinent studies: EKG 09/22/2021: Atrial fibrillation with controlled ventricular response at a rate of 70 bpm.  Normal axis.  Right bundle branch block.  Anterolateral T wave inversions, consider ischemia.  Compared to EKG 08/26/2020, no significant change.  PCV ECHOCARDIOGRAM COMPLETE 041/63/8453Normal LV systolic function with EF 64%. Left ventricle cavity is normal in size. Mild concentric remodeling of the left ventricle. Normal global wall motion. Indeterminate diastolic filling pattern, Indeterminate diastolic filling pattern due to atrial fibrillation. Elevated LAP. Calculated EF 64%. Left atrial cavity is severely dilated at 5.2 cm. Trileaflet aortic valve. No evidence of aortic stenosis. Mild (Grade I) aortic regurgitation. Structurally normal mitral valve. No evidence of mitral stenosis. Mild (Grade I) mitral regurgitation. Structurally normal tricuspid valve. No evidence of tricuspid stenosis. Mild tricuspid regurgitation. Mild pulmonary hypertension. RVSP measures 38 mmHg. Compared to the study done on 04/12/2020, no significant change.  EKG 03/27/2021:  Atrial fibrillation with controlled ventricular response at a rate of 69 bpm.  Normal axis.  Right bundle branch block.  Anterolateral T wave inversions, consider ischemia.  Compared to EKG 08/26/2020, no significant change.  EKG 08/26/2020:  Atrial Fibrillation with controlled ventricular response  Right Bundle Branch Block Lateral T wave changes are nonspecific  Echocardiogram 04/12/2020:  Left ventricle cavity is normal in size and wall thickness. Normal global  wall motion. Normal LV systolic function with EF 66%. Normal diastolic  filling pattern.  Left atrial cavity is normal in size.  Moderate (Grade II) mitral regurgitation.  Mild  tricuspid regurgitation. Estimated pulmonary artery systolic pressure  39 mmHg.   Lexiscan (Walking with mod Bruce)Tetrofosmin Stress Test  04/11/2020: Nondiagnostic ECG stress. Resting EKG demonstrated normal sinus rhythm. Left QRS axis deviation in the presence of right bundle branch block. Peak EKG revealed no significant ST-T change from baseline abnormality. Myocardial perfusion is normal. Overall LV systolic function is normal without regional wall motion abnormalities. Stress LV EF: 76%.  No previous exam available for comparison. Low risk.   EKG 04/07/2020: Sinus rhythm  Occasional PAC   Right bundle branch block Anterolateral T wave inversion, consider ischemia   EKG 03/16/2020:  Atrial Fibrillation Right Bundle Branch Block Lateral T wave changes are nonspecific   Recent labs: CMP Latest Ref Rng & Units 03/27/2021 09/02/2020 08/26/2020  Glucose 65 - 99 mg/dL 98 105(H) 90  BUN 8 - 27 mg/dL 26 14 12   Creatinine 0.76 - 1.27 mg/dL 1.24 1.11 1.13  Sodium 134 - 144 mmol/L 140 138 142  Potassium 3.5 - 5.2 mmol/L 4.4 4.2 4.5  Chloride 96 - 106 mmol/L 103 104 106  CO2 20 - 29 mmol/L 21 18(L) 19(L)  Calcium 8.6 - 10.2 mg/dL 9.9 9.4 9.3  Total Protein 6.5 - 8.1 g/dL - - -  Total Bilirubin 0.3 - 1.2 mg/dL - - -  Alkaline Phos 38 - 126 U/L - - -  AST 15 - 41 U/L - - -  ALT 0 - 44 U/L - - -   CBC Latest Ref Rng & Units 04/16/2018 04/15/2018 04/15/2018  WBC 4.0 - 10.5 K/uL 6.2 6.2 5.9  Hemoglobin 13.0 - 17.0 g/dL 11.1(L) 11.3(L) 11.0(L)  Hematocrit 39.0 - 52.0 % 36.5(L) 36.3(L) 35.0(L)  Platelets 150 - 400 K/uL 229 238 237   Lipid Panel  No results found for: CHOL, TRIG, HDL, CHOLHDL, VLDL, LDLCALC, LDLDIRECT HEMOGLOBIN A1C No results found for: HGBA1C, MPG TSH No results for input(s): TSH in the last 8760 hours.  External labs:  02/22/2021: HDL 55, LDL 84, total cholesterol 155, triglycerides 69 A1c 5.7% BUN 21, creatinine 1.01, GFR >60  09/02/2020: Glucose 105, BUN 14,  creatinine 1.11, GFR 65, sodium 130, potassium 4.2  BNP 436  02/01/2020: Glucose 111, BUN/Cr 15/1.09. EGFR 77. Na/K 139/4.1. Rest of the CMP normal H/H 12.9/43.5. MCV 65.6. Platelets 238 HbA1C 5.6% Chol 206, TG 61, HDL 53, LDL 138   Review of Systems  Cardiovascular:  Positive for leg swelling (stable). Negative for chest pain, claudication, dyspnea on exertion, near-syncope, orthopnea, palpitations, paroxysmal nocturnal dyspnea and syncope.  Respiratory:  Negative for shortness of breath.   Neurological:  Negative for dizziness and weakness.        Vitals:   09/22/21 1343 09/22/21 1349  BP: (!) 145/77 136/84  Pulse: 92 71  Resp: 17   Temp: 98.4 F (36.9 C)   SpO2: 98%      Body mass index is 33.38 kg/m. Filed Weights   09/22/21 1343  Weight: 253 lb (114.8 kg)     Objective:   Physical Exam Vitals and nursing note reviewed.  Constitutional:      General: He is not in acute distress. Neck:     Vascular: No carotid bruit or JVD.  Cardiovascular:     Rate and Rhythm: Normal rate. Rhythm irregular.     Pulses: Intact distal  pulses.     Heart sounds: Normal heart sounds, S1 normal and S2 normal. No murmur heard.   No gallop.  Pulmonary:     Effort: Pulmonary effort is normal. No respiratory distress.     Breath sounds: Normal breath sounds. No wheezing, rhonchi or rales.  Musculoskeletal:     Right lower leg: Edema (minimal) present.     Left lower leg: Edema (minimal) present.  Neurological:     Mental Status: He is alert.       Assessment & Recommendations:   76 y.o. African-American male with with hypertension, hyperlipidemia, paroxysmal atrial fibrillation, obstructive sleep apnea on CPAP.  History of prostate cancer.  Bilateral leg edema:  Echocardiogram with preserved LVEF, unchanged compared to 03/2020. Continues to take Lasix as needed, several times per month  Paroxysmal atrial fibrillation: He is asymptomatic from A. fib standpoint.    Discussed with patient option to continue rate control strategy, versus considering direct-current cardioversion as patient has likely been in persistent atrial fibrillation.  Shared decision was to proceed with 2-week cardiac monitor to evaluate for atrial fibrillation burden and whether patient is having paroxysmal or persistent A-fib Reviewed results of echocardiogram, details above.  Structurally normal heart with no severe valvular abnormalities. Continue Bystolic [id number] score 2, annual stroke risk 2.2%.  His annual stroke risk will only increase as age progresses. Continue eliquis 5 mg bid.   Hypertension:  Blood pressure was initially elevated in the office, it is well controlled on home readings and improved on recheck today Continue amlodipine, hydrochlorothiazide, irbesartan, Bystolic.  Hyperlipidemia: Continue statin therapy  Follow up and recommendations pending results of cardiac monitor.   Alethia Berthold, PA-C 09/22/2021, 2:15 PM Office: [phone]

## 2021-09-22 ENCOUNTER — Other Ambulatory Visit: Payer: Self-pay

## 2021-09-22 ENCOUNTER — Ambulatory Visit: Payer: Medicare HMO | Admitting: Student

## 2021-09-22 ENCOUNTER — Inpatient Hospital Stay: Payer: Medicare HMO

## 2021-09-22 ENCOUNTER — Encounter: Payer: Self-pay | Admitting: Student

## 2021-09-22 VITALS — BP 136/84 | HR 71 | Temp 98.4°F | Resp 17 | Ht 73.0 in | Wt 253.0 lb

## 2021-09-22 DIAGNOSIS — I480 Paroxysmal atrial fibrillation: Secondary | ICD-10-CM

## 2021-09-22 DIAGNOSIS — E782 Mixed hyperlipidemia: Secondary | ICD-10-CM | POA: Diagnosis not present

## 2021-09-22 DIAGNOSIS — I10 Essential (primary) hypertension: Secondary | ICD-10-CM

## 2021-09-25 ENCOUNTER — Ambulatory Visit: Payer: Medicare HMO | Admitting: Student

## 2021-09-25 DIAGNOSIS — E7849 Other hyperlipidemia: Secondary | ICD-10-CM | POA: Diagnosis not present

## 2021-09-25 DIAGNOSIS — J301 Allergic rhinitis due to pollen: Secondary | ICD-10-CM | POA: Diagnosis not present

## 2021-09-25 DIAGNOSIS — I4891 Unspecified atrial fibrillation: Secondary | ICD-10-CM | POA: Diagnosis not present

## 2021-09-25 DIAGNOSIS — E1169 Type 2 diabetes mellitus with other specified complication: Secondary | ICD-10-CM | POA: Diagnosis not present

## 2021-10-06 DIAGNOSIS — C61 Malignant neoplasm of prostate: Secondary | ICD-10-CM | POA: Diagnosis not present

## 2021-10-11 DIAGNOSIS — I480 Paroxysmal atrial fibrillation: Secondary | ICD-10-CM | POA: Diagnosis not present

## 2021-10-18 DIAGNOSIS — I480 Paroxysmal atrial fibrillation: Secondary | ICD-10-CM | POA: Diagnosis not present

## 2021-10-25 ENCOUNTER — Telehealth: Payer: Self-pay | Admitting: Family Medicine

## 2021-10-25 DIAGNOSIS — G4733 Obstructive sleep apnea (adult) (pediatric): Secondary | ICD-10-CM

## 2021-10-25 NOTE — Telephone Encounter (Signed)
Faxed order for supplies to [facility] at [phone]. Received fax confirmation. ? ?I called pt and informed him that order has been sent. He verbalized understanding. ?

## 2021-10-25 NOTE — Telephone Encounter (Signed)
Pt called stating that he was having issues with leaking etc with his cpap machine so he went to the local [facility] and they fitted him with a face mask and he stated that it worked well. Pt would like to know if a Rx can be sent to them in order for him to receive the supplies. Pt gave number [phone] to be called for the supplies. ?

## 2021-10-27 DIAGNOSIS — H2511 Age-related nuclear cataract, right eye: Secondary | ICD-10-CM | POA: Diagnosis not present

## 2021-10-27 DIAGNOSIS — H52203 Unspecified astigmatism, bilateral: Secondary | ICD-10-CM | POA: Diagnosis not present

## 2021-11-07 ENCOUNTER — Encounter (HOSPITAL_COMMUNITY): Payer: Self-pay | Admitting: Cardiology

## 2021-11-07 NOTE — Progress Notes (Signed)
Attempted to obtain medical history via telephone, unable to reach at this time. I left a voicemail to return pre surgical testing department's phone call.  

## 2021-11-09 DIAGNOSIS — E7849 Other hyperlipidemia: Secondary | ICD-10-CM | POA: Diagnosis not present

## 2021-11-09 DIAGNOSIS — E782 Mixed hyperlipidemia: Secondary | ICD-10-CM | POA: Diagnosis not present

## 2021-11-09 DIAGNOSIS — I4891 Unspecified atrial fibrillation: Secondary | ICD-10-CM | POA: Diagnosis not present

## 2021-11-09 DIAGNOSIS — I10 Essential (primary) hypertension: Secondary | ICD-10-CM | POA: Diagnosis not present

## 2021-11-09 DIAGNOSIS — Z6833 Body mass index (BMI) 33.0-33.9, adult: Secondary | ICD-10-CM | POA: Diagnosis not present

## 2021-11-09 DIAGNOSIS — Z0000 Encounter for general adult medical examination without abnormal findings: Secondary | ICD-10-CM | POA: Diagnosis not present

## 2021-11-09 DIAGNOSIS — R7303 Prediabetes: Secondary | ICD-10-CM | POA: Diagnosis not present

## 2021-11-09 DIAGNOSIS — I483 Typical atrial flutter: Secondary | ICD-10-CM | POA: Diagnosis not present

## 2021-11-09 DIAGNOSIS — R739 Hyperglycemia, unspecified: Secondary | ICD-10-CM | POA: Diagnosis not present

## 2021-11-09 DIAGNOSIS — R609 Edema, unspecified: Secondary | ICD-10-CM | POA: Diagnosis not present

## 2021-11-14 ENCOUNTER — Other Ambulatory Visit: Payer: Self-pay

## 2021-11-14 ENCOUNTER — Encounter (HOSPITAL_COMMUNITY)
Admission: RE | Disposition: A | Discharge status: Home / Self Care | Payer: Self-pay | Source: Home / Self Care | Attending: Cardiology

## 2021-11-14 ENCOUNTER — Ambulatory Visit (HOSPITAL_BASED_OUTPATIENT_CLINIC_OR_DEPARTMENT_OTHER): Payer: Medicare HMO | Admitting: Anesthesiology

## 2021-11-14 ENCOUNTER — Ambulatory Visit (HOSPITAL_COMMUNITY)
Admission: RE | Admit: 2021-11-14 | Discharge: 2021-11-14 | Disposition: A | Discharge status: Home / Self Care | Payer: Medicare HMO | Attending: Cardiology | Admitting: Cardiology

## 2021-11-14 ENCOUNTER — Ambulatory Visit (HOSPITAL_COMMUNITY): Payer: Medicare HMO | Admitting: Anesthesiology

## 2021-11-14 DIAGNOSIS — I4819 Other persistent atrial fibrillation: Secondary | ICD-10-CM | POA: Diagnosis not present

## 2021-11-14 DIAGNOSIS — Z7901 Long term (current) use of anticoagulants: Secondary | ICD-10-CM | POA: Diagnosis not present

## 2021-11-14 DIAGNOSIS — I10 Essential (primary) hypertension: Secondary | ICD-10-CM | POA: Insufficient documentation

## 2021-11-14 DIAGNOSIS — E785 Hyperlipidemia, unspecified: Secondary | ICD-10-CM | POA: Diagnosis not present

## 2021-11-14 DIAGNOSIS — Z8546 Personal history of malignant neoplasm of prostate: Secondary | ICD-10-CM | POA: Insufficient documentation

## 2021-11-14 DIAGNOSIS — G4733 Obstructive sleep apnea (adult) (pediatric): Secondary | ICD-10-CM | POA: Insufficient documentation

## 2021-11-14 DIAGNOSIS — I480 Paroxysmal atrial fibrillation: Secondary | ICD-10-CM

## 2021-11-14 DIAGNOSIS — I4891 Unspecified atrial fibrillation: Secondary | ICD-10-CM | POA: Diagnosis not present

## 2021-11-14 HISTORY — PX: CARDIOVERSION: SHX1299

## 2021-11-14 LAB — POCT I-STAT, CHEM 8
BUN: 14 mg/dL (ref 8–23)
Calcium, Ion: 1.27 mmol/L (ref 1.15–1.40)
Chloride: 106 mmol/L (ref 98–111)
Creatinine, Ser: 0.9 mg/dL (ref 0.61–1.24)
Glucose, Bld: 114 mg/dL — ABNORMAL HIGH (ref 70–99)
HCT: 34 % — ABNORMAL LOW (ref 39.0–52.0)
Hemoglobin: 11.6 g/dL — ABNORMAL LOW (ref 13.0–17.0)
Potassium: 3.5 mmol/L (ref 3.5–5.1)
Sodium: 141 mmol/L (ref 135–145)
TCO2: 23 mmol/L (ref 22–32)

## 2021-11-14 SURGERY — CARDIOVERSION
Anesthesia: General

## 2021-11-14 MED ORDER — LIDOCAINE 2% (20 MG/ML) 5 ML SYRINGE
INTRAMUSCULAR | Status: DC | PRN
  Administered 2021-11-14: 40 mg via INTRAVENOUS

## 2021-11-14 MED ORDER — PROPOFOL 10 MG/ML IV BOLUS
INTRAVENOUS | Status: DC | PRN
  Administered 2021-11-14: 70 mg via INTRAVENOUS

## 2021-11-14 MED ORDER — SODIUM CHLORIDE 0.9 % IV SOLN
INTRAVENOUS | Status: AC | PRN
Start: 2021-11-14 — End: 2021-11-14
  Administered 2021-11-14: 500 mL via INTRAVENOUS

## 2021-11-14 MED ORDER — SODIUM CHLORIDE 0.9 % IV SOLN: INTRAVENOUS | Status: DC

## 2021-11-14 NOTE — Anesthesia Postprocedure Evaluation (Signed)
Anesthesia Post Note ? ?Patient: Gregory Odonnell ? ?Procedure(s) Performed: CARDIOVERSION ? ?  ? ?Patient location during evaluation: PACU ?Anesthesia Type: General ?Level of consciousness: awake and alert ?Pain management: pain level controlled ?Vital Signs Assessment: post-procedure vital signs reviewed and stable ?Respiratory status: spontaneous breathing, nonlabored ventilation, respiratory function stable and patient connected to nasal cannula oxygen ?Cardiovascular status: blood pressure returned to baseline and stable ?Postop Assessment: no apparent nausea or vomiting ?Anesthetic complications: no ? ? ?No notable events documented. ? ?Last Vitals:  ?Vitals:  ? 11/14/21 1016 11/14/21 1025  ?BP: 116/64 126/60  ?Pulse: (!) 55 (!) 57  ?Resp: 14 12  ?Temp:    ?SpO2: 100% 97%  ?  ?Last Pain:  ?Vitals:  ? 11/14/21 1025  ?TempSrc:   ?PainSc: 0-No pain  ? ? ?  ?  ?  ?  ?  ?  ? ?Effie Berkshire ? ? ? ? ?

## 2021-11-14 NOTE — Interval H&P Note (Signed)
History and Physical Interval Note: ? ?11/14/2021 ?10:01 AM ? ?Gregory Odonnell  has presented today for surgery, with the diagnosis of AFIB.  The various methods of treatment have been discussed with the patient and family. After consideration of risks, benefits and other options for treatment, the patient has consented to  Procedure(s): ?CARDIOVERSION (N/A) as a surgical intervention.  The patient's history has been reviewed, patient examined, no change in status, stable for surgery.  I have reviewed the patient's chart and labs.  Questions were answered to the patient's satisfaction.   ? ? ?Adrian Prows ? ? ?

## 2021-11-14 NOTE — CV Procedure (Signed)
Direct current cardioversion 11/14/2021 10:20 AM ? ?Indication persistent  A. Fibrillation. ? ?Procedure: Using 70 mg of IV Propofol and 40 IV Lidocaine (for reducing venous pain) for achieving deep sedation, synchronized direct current cardioversion performed. Patient was delivered with 120x1 and 150 Joules of electricity X 1 with success to NSR. Patient tolerated the procedure well. No immediate complication noted.  ? ?Allergies as of 11/14/2021   ?No Known Allergies ?  ? ?  ?Medication List  ?  ? ?TAKE these medications   ? ?amLODipine 10 MG tablet ?Commonly known as: NORVASC ?Take 10 mg by mouth daily. ?  ?apixaban 5 MG Tabs tablet ?Commonly known as: Eliquis ?Take 1 tablet (5 mg total) by mouth 2 (two) times daily. ?  ?Breo Ellipta 100-25 MCG/ACT Aepb ?Generic drug: fluticasone furoate-vilanterol ?Inhale 1 puff into the lungs daily as needed (congestion/ wheezing). ?  ?furosemide 20 MG tablet ?Commonly known as: LASIX ?Take 1 tablet by mouth daily as needed for fluid (swelling or shortness of breath) ?  ?hydrochlorothiazide 12.5 MG capsule ?Commonly known as: MICROZIDE ?Take 1 capsule (12.5 mg total) by mouth daily. ?  ?irbesartan-hydrochlorothiazide 300-12.5 MG tablet ?Commonly known as: AVALIDE ?Take 1 tablet by mouth daily. ?  ?JOINT HEALTH PO ?Take 1 tablet by mouth daily. Triple action ?  ?montelukast 10 MG tablet ?Commonly known as: SINGULAIR ?Take 10 mg by mouth at bedtime. ?  ?nebivolol 10 MG tablet ?Commonly known as: BYSTOLIC ?Take 20 mg by mouth daily. ?  ?rosuvastatin 10 MG tablet ?Commonly known as: CRESTOR ?Take 10 mg by mouth daily. ?  ? ?  ? ? ? ? ?Adrian Prows, MD, [facility] ?11/14/2021, 10:20 AM ?Office: [phone] ?Fax: [phone] ?Pager: [phone]  ? ?

## 2021-11-14 NOTE — Discharge Instructions (Signed)

## 2021-11-14 NOTE — H&P (View-Only) (Signed)
? ?Subjective:  ? ?Gregory Odonnell, male    DOB: 10/15/1945, 76 y.o.   MRN: [id number] ? ?CC: Atrial fibrillation ? ? ? ?HPI ? ?76 y.o. African-American male with hypertension, hyperlipidemia, paroxysmal atrial fibrillation, obstructive sleep apnea on CPAP.  History of prostate cancer. ? ?She is now scheduled for cardioversion due to persistent atrial fibrillation. Although minimally symptomatic, it was felt to proceed with cardioversion to maintain LV diastolic function.  ? ?Patient continues to do well from a cardiac standpoint and remains asymptomatic.  Denies chest pain, dyspnea, dizziness, palpitations.  He has mild intermittent bilateral leg edema, but he wears compression stockings and this improves.  He notes that home blood pressure cuff continues to occasionally note "irregular rhythm". ? ?Continues to tolerate anticoagulation without bleeding diathesis.  He reports home blood pressure readings which are well controlled. ? ?No current facility-administered medications on file prior to encounter.  ? ?Current Outpatient Medications on File Prior to Encounter  ?Medication Sig Dispense Refill  ? amLODipine (NORVASC) 10 MG tablet Take 10 mg by mouth daily.    ? apixaban (ELIQUIS) 5 MG TABS tablet Take 1 tablet (5 mg total) by mouth 2 (two) times daily. 180 tablet 3  ? BREO ELLIPTA 100-25 MCG/INH AEPB Inhale 1 puff into the lungs daily as needed (congestion/ wheezing).    ? furosemide (LASIX) 20 MG tablet Take 1 tablet by mouth daily as needed for fluid (swelling or shortness of breath) 30 tablet 0  ? hydrochlorothiazide (MICROZIDE) 12.5 MG capsule Take 1 capsule (12.5 mg total) by mouth daily. 90 capsule 0  ? irbesartan-hydrochlorothiazide (AVALIDE) 300-12.5 MG tablet Take 1 tablet by mouth daily.    ? Misc Natural Products (JOINT HEALTH PO) Take 1 tablet by mouth daily. Triple action    ? montelukast (SINGULAIR) 10 MG tablet Take 10 mg by mouth at bedtime.    ? nebivolol (BYSTOLIC) 10 MG tablet Take 20 mg by  mouth daily.    ? rosuvastatin (CRESTOR) 10 MG tablet Take 10 mg by mouth daily.    ? ? ?Cardiovascular and other pertinent studies: ?EKG 09/22/2021: ?Atrial fibrillation with controlled ventricular response at a rate of 70 bpm.  Normal axis.  Right bundle branch block.  Anterolateral T wave inversions, consider ischemia.  Compared to EKG 08/26/2020, no significant change. ? ?PCV ECHOCARDIOGRAM COMPLETE 04/06/2021 ?Normal LV systolic function with EF 64%. Left ventricle cavity is normal in size. Mild concentric remodeling of the left ventricle. Normal global wall motion. Indeterminate diastolic filling pattern, Indeterminate diastolic filling pattern due to atrial fibrillation. Elevated LAP. Calculated EF 64%. ?Left atrial cavity is severely dilated at 5.2 cm. ?Trileaflet aortic valve. No evidence of aortic stenosis. Mild (Grade I) aortic regurgitation. ?Structurally normal mitral valve. No evidence of mitral stenosis. Mild (Grade I) mitral regurgitation. ?Structurally normal tricuspid valve. No evidence of tricuspid stenosis. Mild tricuspid regurgitation. Mild pulmonary hypertension. RVSP measures 38 mmHg. ?Compared to the study done on 04/12/2020, no significant change. ? ?EKG 03/27/2021:  ?Atrial fibrillation with controlled ventricular response at a rate of 69 bpm.  Normal axis.  Right bundle branch block.  Anterolateral T wave inversions, consider ischemia.  Compared to EKG 08/26/2020, no significant change. ? ?EKG 08/26/2020:  ?Atrial Fibrillation with controlled ventricular response  ?Right Bundle Branch Block ?Lateral T wave changes are nonspecific ? ?Echocardiogram 04/12/2020:  ?Left ventricle cavity is normal in size and wall thickness. Normal global  ?wall motion. Normal LV systolic function with EF 66%. Normal diastolic  ?filling pattern.  ?  Left atrial cavity is normal in size.  ?Moderate (Grade II) mitral regurgitation.  ?Mild tricuspid regurgitation. Estimated pulmonary artery systolic pressure  ?39 mmHg.   ? ?Lexiscan (Walking with mod Bruce)Tetrofosmin Stress Test  04/11/2020: ?Nondiagnostic ECG stress. Resting EKG demonstrated normal sinus rhythm. Left QRS axis deviation in the presence of right bundle branch block. Peak EKG revealed no significant ST-T change from baseline abnormality. ?Myocardial perfusion is normal. ?Overall LV systolic function is normal without regional wall motion abnormalities. Stress LV EF: 76%.  ?No previous exam available for comparison. Low risk.  ? ?Ambulatory cardiac telemetry 09/22/2021 - 10/05/2021 (13 days): ?Predominant underlying rhythm was atrial fibrillation with average heart rate of 66 bpm, however he did not have episodes of A-fib with RVR, maximum 861 bpm.  Rare PVCs.  2 episodes of ventricular tachycardia with the longest lasting 5 beats, which were asymptomatic.  Patient's symptoms correlated with atrial fibrillation with ventricular rate 69-90 bpm and isolated PVCs. ? ?EKG 04/07/2020: ?Sinus rhythm  ?Occasional PAC   ?Right bundle branch block ?Anterolateral T wave inversion, consider ischemia ? ? ?EKG 03/16/2020:  ?Atrial Fibrillation ?Right Bundle Branch Block ?Lateral T wave changes are nonspecific ? ? ?Recent labs: ? ?  Latest Ref Rng & Units 11/14/2021  ?  9:41 AM 03/27/2021  ?  3:00 PM 09/02/2020  ?  9:17 AM  ?CMP  ?Glucose 70 - 99 mg/dL 114   98   105    ?BUN 8 - 23 mg/dL _0 ?Creatinine 0.61 - 1.24 mg/dL 0.90   1.24   1.11    ?Sodium 135 - 145 mmol/L 141   140   138    ?Potassium 3.5 - 5.1 mmol/L 3.5   4.4   4.2    ?Chloride 98 - 111 mmol/L 106   103   104    ?CO2 20 - 29 mmol/L  21   18    ?Calcium 8.6 - 10.2 mg/dL  9.9   9.4    ? ? ?  Latest Ref Rng & Units 11/14/2021  ?  9:41 AM 04/16/2018  ?  6:01 AM 04/15/2018  ?  3:45 PM  ?CBC  ?WBC 4.0 - 10.5 K/uL  6.2   6.2    ?Hemoglobin 13.0 - 17.0 g/dL 11.6   11.1   11.3    ?Hematocrit 39.0 - 52.0 % 34.0   36.5   36.3    ?Platelets 150 - 400 K/uL  229   238    ? ?Lipid Panel  ?No results found for: CHOL, TRIG, HDL,  CHOLHDL, VLDL, LDLCALC, LDLDIRECT ?HEMOGLOBIN A1C ?No results found for: HGBA1C, MPG ?TSH ?No results for input(s): TSH in the last 8760 hours. ? ?External labs:  ?02/22/2021: ?HDL 55, LDL 84, total cholesterol 155, triglycerides 69 ?A1c 5.7% ?BUN 21, creatinine 1.01, GFR >60 ? ?09/02/2020: ?Glucose 105, BUN 14, creatinine 1.11, GFR 65, sodium 130, potassium 4.2  ?BNP 436 ? ?02/01/2020: ?Glucose 111, BUN/Cr 15/1.09. EGFR 77. Na/K 139/4.1. Rest of the CMP normal ?H/H 12.9/43.5. MCV 65.6. Platelets 238 ?HbA1C 5.6% ?Chol 206, TG 61, HDL 53, LDL 138 ? ? ?Review of Systems  ?Cardiovascular:  Positive for leg swelling (stable). Negative for chest pain, claudication, dyspnea on exertion, near-syncope, orthopnea, palpitations, paroxysmal nocturnal dyspnea and syncope.  ?Respiratory:  Negative for shortness of breath.   ?Neurological:  Negative for dizziness and weakness.  ? ?Vitals:  ? 11/14/21 0931  ?BP: 135/69  ?Pulse:  86  ?Temp: 98.3 ?F (36.8 ?C)  ?SpO2: 98%  ? ? ? ?Body mass index is 32.85 kg/m?. ?Filed Weights  ? 11/14/21 0931  ?Weight: 112.9 kg  ? ? ? ?Objective:  ? Physical Exam ?Vitals and nursing note reviewed.  ?Constitutional:   ?   General: He is not in acute distress. ?Neck:  ?   Vascular: No carotid bruit or JVD.  ?Cardiovascular:  ?   Rate and Rhythm: Normal rate. Rhythm irregular.  ?   Pulses: Intact distal pulses.  ?   Heart sounds: Normal heart sounds, S1 normal and S2 normal. No murmur heard. ?  No gallop.  ?Pulmonary:  ?   Effort: Pulmonary effort is normal. No respiratory distress.  ?   Breath sounds: Normal breath sounds. No wheezing, rhonchi or rales.  ?Musculoskeletal:  ?   Right lower leg: Edema (minimal) present.  ?   Left lower leg: Edema (minimal) present.  ?Neurological:  ?   Mental Status: He is alert.  ? ?Assessment & Recommendations:  ? ?75 y.o. African-American male with with hypertension, hyperlipidemia, paroxysmal atrial fibrillation, obstructive sleep apnea on CPAP.  History of prostate  cancer. ?Persistent atrial fibrillation: ?He is asymptomatic from A. fib standpoint.   ?Discussed with patient option to continue rate control strategy, versus considering direct-current cardioversion as pati

## 2021-11-14 NOTE — Anesthesia Preprocedure Evaluation (Addendum)
Anesthesia Evaluation  ?Patient identified by MRN, date of birth, ID band ?Patient awake ? ? ? ?Reviewed: ?Allergy & Precautions, NPO status , Patient's Chart, lab work & pertinent test results ? ?Airway ?Mallampati: I ? ? ? ? ? ? Dental ? ?(+) Teeth Intact, Dental Advisory Given,  ?  ?Pulmonary ?asthma ,  ?  ?breath sounds clear to auscultation ? ? ? ? ? ? Cardiovascular ?hypertension, Pt. on medications ? ?Rhythm:Irregular Rate:Abnormal ? ? ?  ?Neuro/Psych ?negative neurological ROS ?   ? GI/Hepatic ?negative GI ROS, Neg liver ROS,   ?Endo/Other  ?negative endocrine ROS ? Renal/GU ?negative Renal ROS  ? ?  ?Musculoskeletal ? ? Abdominal ?Normal abdominal exam  (+)   ?Peds ? Hematology ?  ?Anesthesia Other Findings ? ? Reproductive/Obstetrics ? ?  ? ? ? ? ? ? ? ? ? ? ? ? ? ?  ?  ? ? ? ? ? ? ? ?Anesthesia Physical ?Anesthesia Plan ? ?ASA: 3 ? ?Anesthesia Plan: General  ? ?Post-op Pain Management: Minimal or no pain anticipated  ? ?Induction: Intravenous ? ?PONV Risk Score and Plan: 2 ? ?Airway Management Planned:  ? ?Additional Equipment: None ? ?Intra-op Plan:  ? ?Post-operative Plan:  ? ?Informed Consent: I have reviewed the patients History and Physical, chart, labs and discussed the procedure including the risks, benefits and alternatives for the proposed anesthesia with the patient or authorized representative who has indicated his/her understanding and acceptance.  ? ? ? ? ? ?Plan Discussed with: CRNA ? ?Anesthesia Plan Comments:   ? ? ? ? ? ?Anesthesia Quick Evaluation ? ?

## 2021-11-14 NOTE — Transfer of Care (Signed)
Immediate Anesthesia Transfer of Care Note ? ?Patient: Gregory Odonnell ? ?Procedure(s) Performed: CARDIOVERSION ? ?Patient Location: PACU and Endoscopy Unit ? ?Anesthesia Type:General ? ?Level of Consciousness: drowsy ? ?Airway & Oxygen Therapy: Patient Spontanous Breathing and Patient connected to nasal cannula oxygen ? ?Post-op Assessment: Report given to RN and Post -op Vital signs reviewed and stable ? ?Post vital signs: Reviewed and stable ? ?Last Vitals:  ?Vitals Value Taken Time  ?BP    ?Temp    ?Pulse    ?Resp    ?SpO2    ? ? ?Last Pain:  ?Vitals:  ? 11/14/21 0931  ?TempSrc: Oral  ?PainSc: 0-No pain  ?   ? ?  ? ?Complications: No notable events documented. ?

## 2021-11-14 NOTE — Progress Notes (Signed)
? ?Subjective:  ? ?Gregory Odonnell, male    DOB: [birth year], 76 y.o.   MRN: [id number] ? ?CC: Atrial fibrillation ? ? ? ?HPI ? ?76 y.o. African-American male with hypertension, hyperlipidemia, paroxysmal atrial fibrillation, obstructive sleep apnea on CPAP.  History of prostate cancer. ? ?She is now scheduled for cardioversion due to persistent atrial fibrillation. Although minimally symptomatic, it was felt to proceed with cardioversion to maintain LV diastolic function.  ? ?Patient continues to do well from a cardiac standpoint and remains asymptomatic.  Denies chest pain, dyspnea, dizziness, palpitations.  He has mild intermittent bilateral leg edema, but he wears compression stockings and this improves.  He notes that home blood pressure cuff continues to occasionally note "irregular rhythm". ? ?Continues to tolerate anticoagulation without bleeding diathesis.  He reports home blood pressure readings which are well controlled. ? ?No current facility-administered medications on file prior to encounter.  ? ?Current Outpatient Medications on File Prior to Encounter  ?Medication Sig Dispense Refill  ? amLODipine (NORVASC) 10 MG tablet Take 10 mg by mouth daily.    ? apixaban (ELIQUIS) 5 MG TABS tablet Take 1 tablet (5 mg total) by mouth 2 (two) times daily. 180 tablet 3  ? BREO ELLIPTA 100-25 MCG/INH AEPB Inhale 1 puff into the lungs daily as needed (congestion/ wheezing).    ? furosemide (LASIX) 20 MG tablet Take 1 tablet by mouth daily as needed for fluid (swelling or shortness of breath) 30 tablet 0  ? hydrochlorothiazide (MICROZIDE) 12.5 MG capsule Take 1 capsule (12.5 mg total) by mouth daily. 90 capsule 0  ? irbesartan-hydrochlorothiazide (AVALIDE) 300-12.5 MG tablet Take 1 tablet by mouth daily.    ? Misc Natural Products (JOINT HEALTH PO) Take 1 tablet by mouth daily. Triple action    ? montelukast (SINGULAIR) 10 MG tablet Take 10 mg by mouth at bedtime.    ? nebivolol (BYSTOLIC) 10 MG tablet Take 20 mg by  mouth daily.    ? rosuvastatin (CRESTOR) 10 MG tablet Take 10 mg by mouth daily.    ? ? ?Cardiovascular and other pertinent studies: ?EKG 09/22/2021: ?Atrial fibrillation with controlled ventricular response at a rate of 70 bpm.  Normal axis.  Right bundle branch block.  Anterolateral T wave inversions, consider ischemia.  Compared to EKG 08/26/2020, no significant change. ? ?PCV ECHOCARDIOGRAM COMPLETE 04/06/2021 ?Normal LV systolic function with EF 64%. Left ventricle cavity is normal in size. Mild concentric remodeling of the left ventricle. Normal global wall motion. Indeterminate diastolic filling pattern, Indeterminate diastolic filling pattern due to atrial fibrillation. Elevated LAP. Calculated EF 64%. ?Left atrial cavity is severely dilated at 5.2 cm. ?Trileaflet aortic valve. No evidence of aortic stenosis. Mild (Grade I) aortic regurgitation. ?Structurally normal mitral valve. No evidence of mitral stenosis. Mild (Grade I) mitral regurgitation. ?Structurally normal tricuspid valve. No evidence of tricuspid stenosis. Mild tricuspid regurgitation. Mild pulmonary hypertension. RVSP measures 38 mmHg. ?Compared to the study done on 04/12/2020, no significant change. ? ?EKG 03/27/2021:  ?Atrial fibrillation with controlled ventricular response at a rate of 69 bpm.  Normal axis.  Right bundle branch block.  Anterolateral T wave inversions, consider ischemia.  Compared to EKG 08/26/2020, no significant change. ? ?EKG 08/26/2020:  ?Atrial Fibrillation with controlled ventricular response  ?Right Bundle Branch Block ?Lateral T wave changes are nonspecific ? ?Echocardiogram 04/12/2020:  ?Left ventricle cavity is normal in size and wall thickness. Normal global  ?wall motion. Normal LV systolic function with EF 66%. Normal diastolic  ?filling pattern.  ?  Left atrial cavity is normal in size.  ?Moderate (Grade II) mitral regurgitation.  ?Mild tricuspid regurgitation. Estimated pulmonary artery systolic pressure  ?39 mmHg.   ? ?Lexiscan (Walking with mod Bruce)Tetrofosmin Stress Test  04/11/2020: ?Nondiagnostic ECG stress. Resting EKG demonstrated normal sinus rhythm. Left QRS axis deviation in the presence of right bundle branch block. Peak EKG revealed no significant ST-T change from baseline abnormality. ?Myocardial perfusion is normal. ?Overall LV systolic function is normal without regional wall motion abnormalities. Stress LV EF: 76%.  ?No previous exam available for comparison. Low risk.  ? ?Ambulatory cardiac telemetry 09/22/2021 - 10/05/2021 (13 days): ?Predominant underlying rhythm was atrial fibrillation with average heart rate of 66 bpm, however he did not have episodes of A-fib with RVR, maximum 861 bpm.  Rare PVCs.  2 episodes of ventricular tachycardia with the longest lasting 5 beats, which were asymptomatic.  Patient's symptoms correlated with atrial fibrillation with ventricular rate 69-90 bpm and isolated PVCs. ? ?EKG 04/07/2020: ?Sinus rhythm  ?Occasional PAC   ?Right bundle branch block ?Anterolateral T wave inversion, consider ischemia ? ? ?EKG 03/16/2020:  ?Atrial Fibrillation ?Right Bundle Branch Block ?Lateral T wave changes are nonspecific ? ? ?Recent labs: ? ?  Latest Ref Rng & Units 11/14/2021  ?  9:41 AM 03/27/2021  ?  3:00 PM 09/02/2020  ?  9:17 AM  ?CMP  ?Glucose 70 - 99 mg/dL 114   98   105    ?BUN 8 - 23 mg/dL _0 ?Creatinine 0.61 - 1.24 mg/dL 0.90   1.24   1.11    ?Sodium 135 - 145 mmol/L 141   140   138    ?Potassium 3.5 - 5.1 mmol/L 3.5   4.4   4.2    ?Chloride 98 - 111 mmol/L 106   103   104    ?CO2 20 - 29 mmol/L  21   18    ?Calcium 8.6 - 10.2 mg/dL  9.9   9.4    ? ? ?  Latest Ref Rng & Units 11/14/2021  ?  9:41 AM 04/16/2018  ?  6:01 AM 04/15/2018  ?  3:45 PM  ?CBC  ?WBC 4.0 - 10.5 K/uL  6.2   6.2    ?Hemoglobin 13.0 - 17.0 g/dL 11.6   11.1   11.3    ?Hematocrit 39.0 - 52.0 % 34.0   36.5   36.3    ?Platelets 150 - 400 K/uL  229   238    ? ?Lipid Panel  ?No results found for: CHOL, TRIG, HDL,  CHOLHDL, VLDL, LDLCALC, LDLDIRECT ?HEMOGLOBIN A1C ?No results found for: HGBA1C, MPG ?TSH ?No results for input(s): TSH in the last 8760 hours. ? ?External labs:  ?02/22/2021: ?HDL 55, LDL 84, total cholesterol 155, triglycerides 69 ?A1c 5.7% ?BUN 21, creatinine 1.01, GFR >60 ? ?09/02/2020: ?Glucose 105, BUN 14, creatinine 1.11, GFR 65, sodium 130, potassium 4.2  ?BNP 436 ? ?02/01/2020: ?Glucose 111, BUN/Cr 15/1.09. EGFR 77. Na/K 139/4.1. Rest of the CMP normal ?H/H 12.9/43.5. MCV 65.6. Platelets 238 ?HbA1C 5.6% ?Chol 206, TG 61, HDL 53, LDL 138 ? ? ?Review of Systems  ?Cardiovascular:  Positive for leg swelling (stable). Negative for chest pain, claudication, dyspnea on exertion, near-syncope, orthopnea, palpitations, paroxysmal nocturnal dyspnea and syncope.  ?Respiratory:  Negative for shortness of breath.   ?Neurological:  Negative for dizziness and weakness.  ? ?Vitals:  ? 11/14/21 0931  ?BP: 135/69  ?Pulse:  86  ?Temp: 98.3 ?F (36.8 ?C)  ?SpO2: 98%  ? ? ? ?Body mass index is 32.85 kg/m?. Danley Danker Weights  ? 11/14/21 0931  ?Weight: 112.9 kg  ? ? ? ?Objective:  ? Physical Exam ?Vitals and nursing note reviewed.  ?Constitutional:   ?   General: He is not in acute distress. ?Neck:  ?   Vascular: No carotid bruit or JVD.  ?Cardiovascular:  ?   Rate and Rhythm: Normal rate. Rhythm irregular.  ?   Pulses: Intact distal pulses.  ?   Heart sounds: Normal heart sounds, S1 normal and S2 normal. No murmur heard. ?  No gallop.  ?Pulmonary:  ?   Effort: Pulmonary effort is normal. No respiratory distress.  ?   Breath sounds: Normal breath sounds. No wheezing, rhonchi or rales.  ?Musculoskeletal:  ?   Right lower leg: Edema (minimal) present.  ?   Left lower leg: Edema (minimal) present.  ?Neurological:  ?   Mental Status: He is alert.  ? ?Assessment & Recommendations:  ? ?76 y.o. African-American male with with hypertension, hyperlipidemia, paroxysmal atrial fibrillation, obstructive sleep apnea on CPAP.  History of prostate  cancer. ?Persistent atrial fibrillation: ?He is asymptomatic from A. fib standpoint.   ?Discussed with patient option to continue rate control strategy, versus considering direct-current cardioversion as pati

## 2021-11-15 ENCOUNTER — Encounter (HOSPITAL_COMMUNITY): Payer: Self-pay | Admitting: Cardiology

## 2021-11-17 ENCOUNTER — Telehealth: Payer: Self-pay

## 2021-11-17 NOTE — Telephone Encounter (Signed)
Pt called and stated that he just had a cardioversion on Tuesday and is having issues with his HR. His BP machine is telling him his HR is irregular. When he checked earlier his HR was around 131, he can not remember his BP. When he rechecked it while on the phone his BP was 169/70 and HR 75. No SOB, dizziness, or pain. Pt wants to know if he should be concerned. Please advise.  ?

## 2021-11-17 NOTE — Telephone Encounter (Signed)
Pt has an appointment with Lawerance Cruel PA, 11/21/2021 at 10:15 am.

## 2021-11-17 NOTE — Telephone Encounter (Signed)
Called and spoke to pt. Pt agreed to keep a BP long and bring into the next OV. Pt will call back to schedule an appt.

## 2021-11-17 NOTE — Telephone Encounter (Signed)
Please set him up for office visit next week for follow up of cardioversion and we can do repeat EKG. Please advise him to continue to trend BP and bring a log

## 2021-11-21 ENCOUNTER — Encounter: Payer: Self-pay | Admitting: Student

## 2021-11-21 ENCOUNTER — Ambulatory Visit: Payer: Medicare HMO | Admitting: Student

## 2021-11-21 VITALS — BP 144/78 | HR 70 | Temp 98.4°F | Resp 17 | Ht 73.0 in | Wt 247.4 lb

## 2021-11-21 DIAGNOSIS — I10 Essential (primary) hypertension: Secondary | ICD-10-CM

## 2021-11-21 DIAGNOSIS — I480 Paroxysmal atrial fibrillation: Secondary | ICD-10-CM | POA: Diagnosis not present

## 2021-11-21 MED ORDER — APIXABAN 5 MG PO TABS: 5.0000 mg | ORAL_TABLET | Freq: Two times a day (BID) | ORAL | 3 refills | Status: DC

## 2021-11-21 NOTE — Progress Notes (Signed)
? ? ?Patient referred by [name], MD for atrial fibrillation ? ?Subjective:  ? ?Gregory Odonnell, male    DOB: Apr 02, 1946, 76 y.o.   MRN: [id number] ? ? ?Chief Complaint  ?Patient presents with  ? S/p cardioversion  ? Abnormal ECG  ? ? ? ?HPI ? ?76 y.o. African-American male with hypertension, hyperlipidemia, paroxysmal atrial fibrillation, obstructive sleep apnea on CPAP.  History of prostate cancer. ? ?Patient was seen 09/22/2021 with concerns of irregular rhythm noted by home blood pressure monitoring.  Therefore ordered cardiac monitor which confirmed patient has been in persistent atrial fibrillation.  He subsequently underwent direct-current cardioversion with subsequent successful return to normal sinus rhythm on 11/14/2021. Patient then called our office 11/17/2021 with complaints of elevated blood pressure and heart rate.  He now presents for follow-up.  Patient brings with him a written log of home blood pressure and heart rate readings over the last several days which are fairly well controlled.  Blood pressure is mildly elevated above goal, however patient admits to dietary indiscretion with high sodium intake lately. ? ?Denies chest pain, dyspnea, dizziness, palpitations. ? ?Current Outpatient Medications on File Prior to Visit  ?Medication Sig Dispense Refill  ? amLODipine (NORVASC) 10 MG tablet Take 10 mg by mouth daily.    ? BREO ELLIPTA 100-25 MCG/INH AEPB Inhale 1 puff into the lungs daily as needed (congestion/ wheezing).    ? furosemide (LASIX) 20 MG tablet Take 1 tablet by mouth daily as needed for fluid (swelling or shortness of breath) 30 tablet 0  ? hydrochlorothiazide (MICROZIDE) 12.5 MG capsule Take 1 capsule (12.5 mg total) by mouth daily. 90 capsule 0  ? irbesartan-hydrochlorothiazide (AVALIDE) 300-12.5 MG tablet Take 1 tablet by mouth daily.    ? Misc Natural Products (JOINT HEALTH PO) Take 1 tablet by mouth daily. Triple action    ? montelukast (SINGULAIR) 10 MG tablet Take 10 mg by  mouth at bedtime.    ? nebivolol (BYSTOLIC) 10 MG tablet Take 20 mg by mouth daily.    ? rosuvastatin (CRESTOR) 10 MG tablet Take 10 mg by mouth daily.    ? ?No current facility-administered medications on file prior to visit.  ? ? ?Cardiovascular and other pertinent studies: ?EKG 11/21/2021:  ?Sinus rhythm at a rate of 68 bpm.  Normal axis.  Right bundle branch block with secondary ST-T wave changes, cannot exclude anterolateral ischemia.  Compared to EKG 09/22/2021, now normal sinus rhythm. ? ?Direct current cardioversion 11/14/2021 10:20 AM ?Indication persistent  A. Fibrillation. ?Procedure: Using 70 mg of IV Propofol and 40 IV Lidocaine (for reducing venous pain) for achieving deep sedation, synchronized direct current cardioversion performed. Patient was delivered with 120x1 and 150 Joules of electricity X 1 with success to NSR. Patient tolerated the procedure well. No immediate complication noted.  ? ?PCV ECHOCARDIOGRAM COMPLETE 04/06/2021 ?Normal LV systolic function with EF 64%. Left ventricle cavity is normal in size. Mild concentric remodeling of the left ventricle. Normal global wall motion. Indeterminate diastolic filling pattern, Indeterminate diastolic filling pattern due to atrial fibrillation. Elevated LAP. Calculated EF 64%. ?Left atrial cavity is severely dilated at 5.2 cm. ?Trileaflet aortic valve. No evidence of aortic stenosis. Mild (Grade I) aortic regurgitation. ?Structurally normal mitral valve. No evidence of mitral stenosis. Mild (Grade I) mitral regurgitation. ?Structurally normal tricuspid valve. No evidence of tricuspid stenosis. Mild tricuspid regurgitation. Mild pulmonary hypertension. RVSP measures 38 mmHg. ?Compared to the study done on 04/12/2020, no significant change. ? ?EKG 03/27/2021:  ?Atrial fibrillation  with controlled ventricular response at a rate of 69 bpm.  Normal axis.  Right bundle branch block.  Anterolateral T wave inversions, consider ischemia.  Compared to EKG 08/26/2020,  no significant change. ? ?EKG 08/26/2020:  ?Atrial Fibrillation with controlled ventricular response  ?Right Bundle Branch Block ?Lateral T wave changes are nonspecific ? ?Echocardiogram 04/12/2020:  ?Left ventricle cavity is normal in size and wall thickness. Normal global  ?wall motion. Normal LV systolic function with EF 66%. Normal diastolic  ?filling pattern.  ?Left atrial cavity is normal in size.  ?Moderate (Grade II) mitral regurgitation.  ?Mild tricuspid regurgitation. Estimated pulmonary artery systolic pressure  ?39 mmHg.  ? ?Lexiscan (Walking with mod Bruce)Tetrofosmin Stress Test  04/11/2020: ?Nondiagnostic ECG stress. Resting EKG demonstrated normal sinus rhythm. Left QRS axis deviation in the presence of right bundle branch block. Peak EKG revealed no significant ST-T change from baseline abnormality. ?Myocardial perfusion is normal. ?Overall LV systolic function is normal without regional wall motion abnormalities. Stress LV EF: 76%.  ?No previous exam available for comparison. Low risk.  ? ?EKG 04/07/2020: ?Sinus rhythm  ?Occasional PAC   ?Right bundle branch block ?Anterolateral T wave inversion, consider ischemia ? ? ?EKG 03/16/2020:  ?Atrial Fibrillation ?Right Bundle Branch Block ?Lateral T wave changes are nonspecific ? ? ?Recent labs: ? ?  Latest Ref Rng & Units 11/14/2021  ?  9:41 AM 03/27/2021  ?  3:00 PM 09/02/2020  ?  9:17 AM  ?CMP  ?Glucose 70 - 99 mg/dL 114   98   105    ?BUN 8 - 23 mg/dL 14   26   14     ?Creatinine 0.61 - 1.24 mg/dL 0.90   1.24   1.11    ?Sodium 135 - 145 mmol/L 141   140   138    ?Potassium 3.5 - 5.1 mmol/L 3.5   4.4   4.2    ?Chloride 98 - 111 mmol/L 106   103   104    ?CO2 20 - 29 mmol/L  21   18    ?Calcium 8.6 - 10.2 mg/dL  9.9   9.4    ? ? ?  Latest Ref Rng & Units 11/14/2021  ?  9:41 AM 04/16/2018  ?  6:01 AM 04/15/2018  ?  3:45 PM  ?CBC  ?WBC 4.0 - 10.5 K/uL  6.2   6.2    ?Hemoglobin 13.0 - 17.0 g/dL 11.6   11.1   11.3    ?Hematocrit 39.0 - 52.0 % 34.0   36.5   36.3     ?Platelets 150 - 400 K/uL  229   238    ? ?Lipid Panel  ?No results found for: CHOL, TRIG, HDL, CHOLHDL, VLDL, LDLCALC, LDLDIRECT ?HEMOGLOBIN A1C ?No results found for: HGBA1C, MPG ?TSH ?No results for input(s): TSH in the last 8760 hours. ? ?External labs:  ?02/22/2021: ?HDL 55, LDL 84, total cholesterol 155, triglycerides 69 ?A1c 5.7% ?BUN 21, creatinine 1.01, GFR >60 ? ?09/02/2020: ?Glucose 105, BUN 14, creatinine 1.11, GFR 65, sodium 130, potassium 4.2  ?BNP 436 ? ?02/01/2020: ?Glucose 111, BUN/Cr 15/1.09. EGFR 77. Na/K 139/4.1. Rest of the CMP normal ?H/H 12.9/43.5. MCV 65.6. Platelets 238 ?HbA1C 5.6% ?Chol 206, TG 61, HDL 53, LDL 138 ? ? ?Review of Systems  ?Cardiovascular:  Positive for leg swelling (stable). Negative for chest pain, claudication, dyspnea on exertion, near-syncope, orthopnea, palpitations, paroxysmal nocturnal dyspnea and syncope.  ?Respiratory:  Negative for shortness of breath.   ?Neurological:  Negative for dizziness and weakness.  ? ?   ? ? ?Vitals:  ? 11/21/21 1001 11/21/21 1012  ?BP: (!) 153/70 (!) 144/78  ?Pulse: 72 70  ?Resp: 17   ?Temp: 98.4 ?F (36.9 ?C)   ?SpO2: 95% 95%  ? ? ? ?Body mass index is 32.64 kg/m?. ?Filed Weights  ? 11/21/21 1001  ?Weight: 247 lb 6.4 oz (112.2 kg)  ? ? ? ?Objective:  ? Physical Exam ?Vitals and nursing note reviewed.  ?Constitutional:   ?   General: He is not in acute distress. ?Neck:  ?   Vascular: No carotid bruit or JVD.  ?Cardiovascular:  ?   Rate and Rhythm: Normal rate and regular rhythm.  ?   Pulses: Intact distal pulses.  ?   Heart sounds: Normal heart sounds, S1 normal and S2 normal. No murmur heard. ?  No gallop.  ?Pulmonary:  ?   Effort: Pulmonary effort is normal. No respiratory distress.  ?   Breath sounds: Normal breath sounds. No wheezing, rhonchi or rales.  ?Musculoskeletal:  ?   Right lower leg: Edema (minimal) present.  ?   Left lower leg: Edema (minimal) present.  ?Neurological:  ?   Mental Status: He is alert.  ? ?   ? ?Assessment &  Recommendations:  ? ?76 y.o. African-American male with with hypertension, hyperlipidemia, paroxysmal atrial fibrillation, obstructive sleep apnea on CPAP.  History of prostate cancer. ? ?Bilateral leg edema:

## 2021-12-04 ENCOUNTER — Other Ambulatory Visit: Payer: Self-pay | Admitting: Student

## 2021-12-06 ENCOUNTER — Telehealth: Payer: Self-pay

## 2021-12-06 NOTE — Telephone Encounter (Signed)
Patient came to office to turn in patient assistance paperwork for Eliquis, MP signed and dated. Paperwork has been faxed to [employer].

## 2022-01-01 ENCOUNTER — Other Ambulatory Visit: Payer: Self-pay

## 2022-01-01 DIAGNOSIS — I480 Paroxysmal atrial fibrillation: Secondary | ICD-10-CM

## 2022-01-01 MED ORDER — APIXABAN 5 MG PO TABS: 5.0000 mg | ORAL_TABLET | Freq: Two times a day (BID) | ORAL | 3 refills | Status: DC

## 2022-01-23 ENCOUNTER — Telehealth: Payer: Self-pay | Admitting: Family Medicine

## 2022-01-23 ENCOUNTER — Other Ambulatory Visit: Payer: Self-pay | Admitting: Neurology

## 2022-01-23 DIAGNOSIS — G4733 Obstructive sleep apnea (adult) (pediatric): Secondary | ICD-10-CM

## 2022-01-23 NOTE — Telephone Encounter (Signed)
Called the patient back. It appears that he has to go to [facility] which is part  [facility]. I have forwarded the orders to [employer] [facility] and will await to hear back from them.

## 2022-01-23 NOTE — Telephone Encounter (Signed)
Patient left a voicemail on my phone stating he needs a new script for his supplies.

## 2022-01-23 NOTE — Telephone Encounter (Signed)
I can send an order to hold him over until his next ov. He is scheduled for his yearly appointment at which point we will order for more.

## 2022-01-23 NOTE — Telephone Encounter (Signed)
Pt states he needs a new prescription and demographic sent to [facility]  please fax: [phone]. Pt insurance is making him switch companies, Pt would like a call from the nurse.

## 2022-02-12 ENCOUNTER — Encounter (HOSPITAL_COMMUNITY): Payer: Self-pay | Admitting: Emergency Medicine

## 2022-02-12 ENCOUNTER — Other Ambulatory Visit: Payer: Self-pay

## 2022-02-12 ENCOUNTER — Emergency Department (HOSPITAL_COMMUNITY)
Admission: EM | Admit: 2022-02-12 | Discharge: 2022-02-12 | Discharge status: Against Medical Advice | Payer: Medicare HMO | Attending: Emergency Medicine | Admitting: Emergency Medicine

## 2022-02-12 DIAGNOSIS — X58XXXA Exposure to other specified factors, initial encounter: Secondary | ICD-10-CM | POA: Diagnosis not present

## 2022-02-12 DIAGNOSIS — Z5321 Procedure and treatment not carried out due to patient leaving prior to being seen by health care provider: Secondary | ICD-10-CM | POA: Diagnosis not present

## 2022-02-12 DIAGNOSIS — S01532A Puncture wound without foreign body of oral cavity, initial encounter: Secondary | ICD-10-CM | POA: Insufficient documentation

## 2022-02-12 NOTE — ED Triage Notes (Signed)
Small puncture wound to tongue.  States bit tongue around 10:30 this am  still bleeding.  Takes eliquist.  Went to an UC and was told to come here

## 2022-02-12 NOTE — ED Notes (Signed)
Patient left.

## 2022-02-12 NOTE — ED Triage Notes (Signed)
Pt holding ice and pressure to tongue

## 2022-03-12 DIAGNOSIS — D644 Congenital dyserythropoietic anemia: Secondary | ICD-10-CM | POA: Diagnosis not present

## 2022-03-12 DIAGNOSIS — I110 Hypertensive heart disease with heart failure: Secondary | ICD-10-CM | POA: Diagnosis not present

## 2022-03-12 DIAGNOSIS — E1169 Type 2 diabetes mellitus with other specified complication: Secondary | ICD-10-CM | POA: Diagnosis not present

## 2022-03-12 DIAGNOSIS — I4891 Unspecified atrial fibrillation: Secondary | ICD-10-CM | POA: Diagnosis not present

## 2022-03-12 DIAGNOSIS — I10 Essential (primary) hypertension: Secondary | ICD-10-CM | POA: Diagnosis not present

## 2022-03-12 DIAGNOSIS — I480 Paroxysmal atrial fibrillation: Secondary | ICD-10-CM | POA: Diagnosis not present

## 2022-03-14 ENCOUNTER — Telehealth: Payer: Self-pay | Admitting: *Deleted

## 2022-03-14 NOTE — Telephone Encounter (Signed)
Called pt. He will make sure to bring CPAP machine to appt tomorrow.

## 2022-03-14 NOTE — Patient Instructions (Incomplete)
Please continue using your CPAP regularly. While your insurance requires that you use CPAP at least 4 hours each night on 70% of the nights, I recommend, that you not skip any nights and use it throughout the night if you can. Getting used to CPAP and staying with the treatment long term does take time and patience and discipline. Untreated obstructive sleep apnea when it is moderate to severe can have an adverse impact on cardiovascular health and raise her risk for heart disease, arrhythmias, hypertension, congestive heart failure, stroke and diabetes. Untreated obstructive sleep apnea causes sleep disruption, nonrestorative sleep, and sleep deprivation. This can have an impact on your day to day functioning and cause daytime sleepiness and impairment of cognitive function, memory loss, mood disturbance, and problems focussing. Using CPAP regularly can improve these symptoms.  Follow up 1 year  

## 2022-03-14 NOTE — Progress Notes (Unsigned)
PATIENT: Gregory Odonnell DOB: 1946/05/14  REASON FOR VISIT: follow up HISTORY FROM: patient  No chief complaint on file.    HISTORY OF PRESENT ILLNESS:  03/14/22 ALL:  Franki returns for OSA on CPAP.   03/15/2021 ALL: [name] is a 76 y.o. male here today for follow up for OSA on CPAP.  HST 06/27/2020 showed moderate OSA with total AHI of 18.1/hr and O2 nadir of 89%. AutoPAP was ordered. He has used CPAP daily since being set up in 12/2020. He does feel that he is sleeping better. He feels more energized and better rested. He denies concerns with machine. He has noted a leak with his nasal mask. He is using a medium nasal mask and feels it is comfortable and fits well. He sleeps on his side and thinks leak if from mask getting shifted. He is monitoring at home and adjusting headgear.     HISTORY: (copied from Dr Guadelupe Sabin previous note)  Dear Dr. Virgina Jock,    I saw your patient, Gregory Odonnell, upon your kind request in my sleep clinic today for initial consultation of his sleep disorder, in particular, concern for underlying obstructive sleep apnea.  The patient is unaccompanied today.  As you know, [name] is a 76 year old right-handed gentleman with an underlying medical history of paroxysmal A. fib, prostate cancer, hypertension, diverticulosis, arthritis, seasonal asthma, and mild obesity, who reports snoring and some difficulty with sleep initiation and sleep maintenance.  He was diagnosed with paroxysmal A. fib not too long ago, it was in the context of preparing for cataract surgery.  He does not have any significant symptoms such as shortness of breath or palpitations or chest pains.  Years ago his wife had voiced concern that he may have pauses in his breathing.  She has not complained about it lately.  They have been married 61 years.  They have 1 grown daughter, age 66 and 81 grandson, age 60, 68 grandchildren.  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   He is a non-smoker and drinks no alcohol, limited caffeine in the form of coffee, 2 cups/day on average.  He typically goes to bed around 10 or 11.  He denies recurrent morning headaches but has nocturia about twice per average night, no obvious or known family history of sleep apnea.  His snoring can be loud and disturbing to his wife but will fluctuate. I reviewed your office note from 05/11/2020.  His Epworth sleepiness score is 2 out of 24, fatigue severity score is 11 out of 63.   REVIEW OF SYSTEMS: Out of a complete 14 system review of symptoms, the patient complains only of the following symptoms, none and all other reviewed systems are negative.   ALLERGIES: No Known Allergies  HOME MEDICATIONS: Outpatient Medications Prior to Visit  Medication Sig Dispense Refill   amLODipine (NORVASC) 10 MG tablet Take 10 mg by mouth daily.     apixaban (ELIQUIS) 5 MG TABS tablet Take 1 tablet (5 mg total) by mouth 2 (two) times daily. 180 tablet 3   BREO ELLIPTA 100-25 MCG/INH AEPB Inhale 1 puff into the lungs daily as needed (congestion/ wheezing).     furosemide (LASIX) 20 MG tablet Take 1 tablet by mouth daily as needed for fluid (swelling or shortness of breath) 30 tablet 0   hydrochlorothiazide (MICROZIDE) 12.5 MG capsule TAKE ONE CAPSULE BY MOUTH ONE TIME DAILY 90 capsule 2   irbesartan-hydrochlorothiazide (AVALIDE) 300-12.5 MG tablet Take 1 tablet  by mouth daily.     Misc Natural Products (JOINT HEALTH PO) Take 1 tablet by mouth daily. Triple action     montelukast (SINGULAIR) 10 MG tablet Take 10 mg by mouth at bedtime.     nebivolol (BYSTOLIC) 10 MG tablet Take 20 mg by mouth daily.     rosuvastatin (CRESTOR) 10 MG tablet Take 10 mg by mouth daily.     No facility-administered medications prior to visit.    PAST MEDICAL HISTORY: Past Medical History:  Diagnosis Date   Arthritis    "maybe in my shoulders and one of my fingers on right hands" (11/24/2015)   Diverticulosis     Hypertension    Pancreatic lesion    routinely followed by [facility])    prostate w/ ?mets to bladder   Seasonal asthma    "sometimes in May"    PAST SURGICAL HISTORY: Past Surgical History:  Procedure Laterality Date   BACK SURGERY     BLADDER SURGERY     CARDIOVERSION N/A 11/14/2021   Procedure: CARDIOVERSION;  Surgeon: Adrian Prows, MD;  Location: [facility] ENDOSCOPY;  Service: Cardiovascular;  Laterality: N/A;   [place]   "herniated disc"   PROSTATECTOMY  2011   TONSILLECTOMY  1950s    FAMILY HISTORY: Family History  Problem Relation Age of Onset   Cancer Father        prostate   Cancer Brother     SOCIAL HISTORY: Social History   Socioeconomic History   Marital status: Married    Spouse name: Not on file   Number of children: 2   Years of education: Not on file   Highest education level: Not on file  Occupational History   Occupation: retired  Tobacco Use   Smoking status: Never   Smokeless tobacco: Never  Vaping Use   Vaping Use: Never used  Substance and Sexual Activity   Alcohol use: Yes    Comment: 11/24/2015 "I drink once q 6-7 months"   Drug use: Not Currently    Types: Marijuana    Comment: "back in the 1970s only"   Sexual activity: Not Currently  Other Topics Concern   Not on file  Social History Narrative   Not on file   Social Determinants of Health   Financial Resource Strain: Not on file  Food Insecurity: Not on file  Transportation Needs: Not on file  Physical Activity: Not on file  Stress: Not on file  Social Connections: Not on file  Intimate Partner Violence: Not on file     PHYSICAL EXAM  There were no vitals filed for this visit.  There is no height or weight on file to calculate BMI.  Generalized: Well developed, in no acute distress  Cardiology: normal rate and rhythm, no murmur noted Respiratory: clear to auscultation bilaterally  Neurological examination  Mentation: Alert  oriented to time, place, history taking. Follows all commands speech and language fluent Cranial nerve II-XII: Pupils were equal round reactive to light. Extraocular movements were full, visual field were full  Motor: The motor testing reveals 5 over 5 strength of all 4 extremities. Good symmetric motor tone is noted throughout.  Gait and station: Gait is normal.    DIAGNOSTIC DATA (LABS, IMAGING, TESTING) - I reviewed patient records, labs, notes, testing and imaging myself where available.      No data to display           Lab Results  Component Value Date   WBC 6.2 04/16/2018   HGB 11.6 (L) 11/14/2021   HCT 34.0 (L) 11/14/2021   MCV 63.9 (L) 04/16/2018   PLT 229 04/16/2018      Component Value Date/Time   NA 141 11/14/2021 0941   NA 140 03/27/2021 1500   K 3.5 11/14/2021 0941   CL 106 11/14/2021 0941   CO2 21 03/27/2021 1500   GLUCOSE 114 (H) 11/14/2021 0941   BUN 14 11/14/2021 0941   BUN 26 03/27/2021 1500   CREATININE 0.90 11/14/2021 0941   CALCIUM 9.9 03/27/2021 1500   PROT 6.5 04/15/2018 0807   ALBUMIN 3.5 04/15/2018 0807   AST 27 04/15/2018 0807   ALT 22 04/15/2018 0807   ALKPHOS 43 04/15/2018 0807   BILITOT 0.7 04/15/2018 0807   GFRNONAA 65 09/02/2020 0917   GFRAA 75 09/02/2020 0917   No results found for: "CHOL", "HDL", "LDLCALC", "LDLDIRECT", "TRIG", "CHOLHDL" No results found for: "HGBA1C" No results found for: "VITAMINB12" No results found for: "TSH"   ASSESSMENT AND PLAN 76 y.o. year old male  has a past medical history of Arthritis, Diverticulosis, Hypertension, Pancreatic lesion, Prostate cancer ([place]), and Seasonal asthma. here with   No diagnosis found.    KAELEB EMOND is doing well on CPAP therapy. Compliance report reveals excellent compliance. He was encouraged to continue using CPAP nightly and for greater than 4 hours each night. He will continue to monitor for leak at home. He will call if leak continues and we will send him for a  mask refitting. Risks of untreated sleep apnea review and education materials provided. Healthy lifestyle habits encouraged. He will follow up in 1 year, sooner if needed. He verbalizes understanding and agreement with this plan.    No orders of the defined types were placed in this encounter.     No orders of the defined types were placed in this encounter.     Debbora Presto, FNP-C 03/14/2022, 7:58 AM [facility] [address] [place], [place] [address] [phone]

## 2022-03-15 ENCOUNTER — Ambulatory Visit: Payer: Medicare HMO | Admitting: Family Medicine

## 2022-03-15 ENCOUNTER — Encounter: Payer: Self-pay | Admitting: Family Medicine

## 2022-03-15 VITALS — BP 146/68 | HR 66 | Ht 73.0 in | Wt 248.6 lb

## 2022-03-15 DIAGNOSIS — Z9989 Dependence on other enabling machines and devices: Secondary | ICD-10-CM

## 2022-03-15 DIAGNOSIS — G4733 Obstructive sleep apnea (adult) (pediatric): Secondary | ICD-10-CM

## 2022-03-26 ENCOUNTER — Encounter: Payer: Self-pay | Admitting: Internal Medicine

## 2022-03-26 ENCOUNTER — Ambulatory Visit: Payer: Medicare HMO | Admitting: Internal Medicine

## 2022-03-26 VITALS — BP 146/73 | HR 66 | Temp 98.2°F | Resp 16 | Ht 73.0 in | Wt 248.8 lb

## 2022-03-26 DIAGNOSIS — I480 Paroxysmal atrial fibrillation: Secondary | ICD-10-CM | POA: Insufficient documentation

## 2022-03-26 DIAGNOSIS — I10 Essential (primary) hypertension: Secondary | ICD-10-CM | POA: Diagnosis not present

## 2022-03-26 DIAGNOSIS — E782 Mixed hyperlipidemia: Secondary | ICD-10-CM

## 2022-03-26 NOTE — Progress Notes (Signed)
Patient referred by Gregory Lei, MD for atrial fibrillation  Subjective:   Gregory Odonnell, male    DOB: January 08, 1946, 76 y.o.   MRN: [id number]   No chief complaint on file.    HPI  76 y.o. African-American male with hypertension, hyperlipidemia, paroxysmal atrial fibrillation, obstructive sleep apnea on CPAP.  History of prostate cancer.  Patient was seen 09/22/2021 with atrial fibrillation. He subsequently underwent direct-current cardioversion with subsequent successful return to normal sinus rhythm on 11/14/2021. He remains in NSR. No issues or side effects to Eliquis.  Denies chest pain, dyspnea, dizziness, palpitations.   Current Outpatient Medications on File Prior to Visit  Medication Sig Dispense Refill   amLODipine (NORVASC) 10 MG tablet Take 10 mg by mouth daily.     apixaban (ELIQUIS) 5 MG TABS tablet Take 1 tablet (5 mg total) by mouth 2 (two) times daily. 180 tablet 3   BREO ELLIPTA 100-25 MCG/INH AEPB Inhale 1 puff into the lungs daily as needed (congestion/ wheezing).     furosemide (LASIX) 20 MG tablet Take 1 tablet by mouth daily as needed for fluid (swelling or shortness of breath) 30 tablet 0   hydrochlorothiazide (MICROZIDE) 12.5 MG capsule TAKE ONE CAPSULE BY MOUTH ONE TIME DAILY 90 capsule 2   irbesartan-hydrochlorothiazide (AVALIDE) 300-12.5 MG tablet Take 1 tablet by mouth daily.     Misc Natural Products (JOINT HEALTH PO) Take 1 tablet by mouth daily. Triple action     montelukast (SINGULAIR) 10 MG tablet Take 10 mg by mouth at bedtime.     nebivolol (BYSTOLIC) 10 MG tablet Take 20 mg by mouth daily.     rosuvastatin (CRESTOR) 10 MG tablet Take 10 mg by mouth daily.     No current facility-administered medications on file prior to visit.    Cardiovascular and other pertinent studies:  EKG 03/26/2022:  Sinus rhythm, RBBB, ST-T wave changes, cannot exclude anterolateral ischemia - no change  EKG 11/21/2021:  Sinus rhythm at a rate of 68 bpm.  Normal  axis.  Right bundle branch block with secondary ST-T wave changes, cannot exclude anterolateral ischemia.  Compared to EKG 09/22/2021, now normal sinus rhythm.  Direct current cardioversion 11/14/2021 10:20 AM Indication persistent  A. Fibrillation. Procedure: Using 70 mg of IV Propofol and 40 IV Lidocaine (for reducing venous pain) for achieving deep sedation, synchronized direct current cardioversion performed. Patient was delivered with 120x1 and 150 Joules of electricity X 1 with success to NSR. Patient tolerated the procedure well. No immediate complication noted.   PCV ECHOCARDIOGRAM COMPLETE 48/18/5631 Normal LV systolic function with EF 64%. Left ventricle cavity is normal in size. Mild concentric remodeling of the left ventricle. Normal global wall motion. Indeterminate diastolic filling pattern, Indeterminate diastolic filling pattern due to atrial fibrillation. Elevated LAP. Calculated EF 64%. Left atrial cavity is severely dilated at 5.2 cm. Trileaflet aortic valve. No evidence of aortic stenosis. Mild (Grade I) aortic regurgitation. Structurally normal mitral valve. No evidence of mitral stenosis. Mild (Grade I) mitral regurgitation. Structurally normal tricuspid valve. No evidence of tricuspid stenosis. Mild tricuspid regurgitation. Mild pulmonary hypertension. RVSP measures 38 mmHg. Compared to the study done on 04/12/2020, no significant change.  EKG 03/27/2021:  Atrial fibrillation with controlled ventricular response at a rate of 69 bpm.  Normal axis.  Right bundle branch block.  Anterolateral T wave inversions, consider ischemia.  Compared to EKG 08/26/2020, no significant change.  EKG 08/26/2020:  Atrial Fibrillation with controlled ventricular response  Right Bundle Branch  Block Lateral T wave changes are nonspecific  Echocardiogram 04/12/2020:  Left ventricle cavity is normal in size and wall thickness. Normal global  wall motion. Normal LV systolic function with EF 66%. Normal  diastolic  filling pattern.  Left atrial cavity is normal in size.  Moderate (Grade II) mitral regurgitation.  Mild tricuspid regurgitation. Estimated pulmonary artery systolic pressure  39 mmHg.   Lexiscan (Walking with mod Bruce)Tetrofosmin Stress Test  04/11/2020: Nondiagnostic ECG stress. Resting EKG demonstrated normal sinus rhythm. Left QRS axis deviation in the presence of right bundle branch block. Peak EKG revealed no significant ST-T change from baseline abnormality. Myocardial perfusion is normal. Overall LV systolic function is normal without regional wall motion abnormalities. Stress LV EF: 76%.  No previous exam available for comparison. Low risk.   EKG 04/07/2020: Sinus rhythm  Occasional PAC   Right bundle branch block Anterolateral T wave inversion, consider ischemia   EKG 03/16/2020:  Atrial Fibrillation Right Bundle Branch Block Lateral T wave changes are nonspecific   Recent labs:    Latest Ref Rng & Units 11/14/2021    9:41 AM 03/27/2021    3:00 PM 09/02/2020    9:17 AM  CMP  Glucose 70 - 99 mg/dL 114  98  105   BUN 8 - 23 mg/dL _0 Creatinine 0.61 - 1.24 mg/dL 0.90  1.24  1.11   Sodium 135 - 145 mmol/L 141  140  138   Potassium 3.5 - 5.1 mmol/L 3.5  4.4  4.2   Chloride 98 - 111 mmol/L 106  103  104   CO2 20 - 29 mmol/L  21  18   Calcium 8.6 - 10.2 mg/dL  9.9  9.4       Latest Ref Rng & Units 11/14/2021    9:41 AM 04/16/2018    6:01 AM 04/15/2018    3:45 PM  CBC  WBC 4.0 - 10.5 K/uL  6.2  6.2   Hemoglobin 13.0 - 17.0 g/dL 11.6  11.1  11.3   Hematocrit 39.0 - 52.0 % 34.0  36.5  36.3   Platelets 150 - 400 K/uL  229  238    Lipid Panel  No results found for: "CHOL", "TRIG", "HDL", "CHOLHDL", "VLDL", "LDLCALC", "LDLDIRECT" HEMOGLOBIN A1C No results found for: "HGBA1C", "MPG" TSH No results for input(s): "TSH" in the last 8760 hours.  External labs:  02/22/2021: HDL 55, LDL 84, total cholesterol 155, triglycerides 69 A1c 5.7% BUN 21,  creatinine 1.01, GFR >60  09/02/2020: Glucose 105, BUN 14, creatinine 1.11, GFR 65, sodium 130, potassium 4.2  BNP 436  02/01/2020: Glucose 111, BUN/Cr 15/1.09. EGFR 77. Na/K 139/4.1. Rest of the CMP normal H/H 12.9/43.5. MCV 65.6. Platelets 238 HbA1C 5.6% Chol 206, TG 61, HDL 53, LDL 138   Review of Systems  Cardiovascular:  Negative for chest pain, claudication, dyspnea on exertion, leg swelling (stable), near-syncope, orthopnea, palpitations, paroxysmal nocturnal dyspnea and syncope.  Respiratory:  Negative for shortness of breath.   Neurological:  Negative for dizziness and weakness.         Vitals:   03/26/22 1401  BP: (!) 146/73  Pulse: 66  Resp: 16  Temp: 98.2 F (36.8 C)  SpO2: 97%     Body mass index is 32.83 kg/m. Filed Weights   03/26/22 1401  Weight: 248 lb 12.8 oz (112.9 kg)     Objective:   Physical Exam Vitals and nursing note reviewed.  Constitutional:  General: He is not in acute distress. HENT:     Head: Normocephalic and atraumatic.  Neck:     Vascular: No carotid bruit or JVD.  Cardiovascular:     Rate and Rhythm: Normal rate and regular rhythm.     Pulses: Normal pulses and intact distal pulses.     Heart sounds: Normal heart sounds, S1 normal and S2 normal. No murmur heard.    No gallop.  Pulmonary:     Effort: Pulmonary effort is normal. No respiratory distress.     Breath sounds: Normal breath sounds. No wheezing, rhonchi or rales.  Abdominal:     General: Bowel sounds are normal.  Musculoskeletal:     Right lower leg: No edema (minimal).     Left lower leg: No edema (minimal).  Neurological:     Mental Status: He is alert.       Assessment & Recommendations:   76 y.o. African-American male with with hypertension, hyperlipidemia, paroxysmal atrial fibrillation   Paroxysmal atrial fibrillation: Underwent direct-current cardioversion with successful return to normal sinus rhythm.  Patient is maintaining normal sinus  rhythm at today's office visit CHA2DS2Vasc score 2, annual stroke risk 2.2%.  His annual stroke risk will only increase as age progresses. Continue eliquis 5 mg bid.  Continue Bystolic  Hypertension:  Blood pressure is mildly elevated in the office. At home, all readings are <130/80 mmHg. Continue amlodipine, hydrochlorothiazide/irbesartan, Bystolic, lasix  Hyperlipidemia: Continue statin therapy     Floydene Flock, DO, [facility] 03/26/2022, 2:17 PM Office: [phone]

## 2022-04-17 ENCOUNTER — Ambulatory Visit: Payer: Self-pay

## 2022-04-17 NOTE — Patient Outreach (Signed)
  Care Coordination   04/17/2022 Name: Gregory Odonnell MRN: [id number] DOB: 11-07-1945   Care Coordination Outreach Attempts:  An unsuccessful telephone outreach was attempted today to offer the patient information about available care coordination services as a benefit of their health plan.   Follow Up Plan:  Additional outreach attempts will be made to offer the patient care coordination information and services.   Encounter Outcome:  No Answer  Care Coordination Interventions Activated:  No   Care Coordination Interventions:  No, not indicated    Daneen Schick, BSW, CDP Social Worker, Certified Dementia Practitioner [facility] Care Management  Care Coordination [phone]

## 2022-04-18 ENCOUNTER — Ambulatory Visit: Payer: Self-pay

## 2022-04-18 NOTE — Patient Instructions (Signed)
Visit Information  Thank you for taking time to visit with me today. Please don't hesitate to contact me if I can be of assistance to you.   Following are the goals we discussed today:   Goals Addressed             This Visit's Progress    COMPLETED: Care Coordination Activities       Care Coordination Interventions: SDoH screening performed - no acute resource challenges identified Determined the patient does not have difficulty with medication costs and/or adherence Performed chart review to note the patient participated in an annual wellness visit on 11/09/21 Provided education on the role of the care coordination team - no follow up desired at this time Instructed the patient to contact his primary care provider as needed         If you are experiencing a Mental Health or [place] or need someone to talk to, please call 1-800-273-TALK (toll free, 24 hour hotline)  Patient verbalizes understanding of instructions and care plan provided today and agrees to view in [place]. Active MyChart status and patient understanding of how to access instructions and care plan via MyChart confirmed with patient.     No further follow up required: Please contact your primary care provider as needed  Daneen Schick, Arita Miss, CDP Social Worker, Certified Dementia Practitioner [place] Coordination [phone]

## 2022-04-18 NOTE — Patient Outreach (Signed)
  Care Coordination   Initial Visit Note   04/18/2022 Name: KAHNER YANIK MRN: [id number] DOB: [birth year]  GARNER DULLEA is a 76 y.o. year old male who sees [name], MD for primary care. I spoke with  Jaynie Collins by phone today.  What matters to the patients health and wellness today?  No concerns, doing well at this time    Goals Addressed             This Visit's Progress    COMPLETED: Care Coordination Activities       Care Coordination Interventions: SDoH screening performed - no acute resource challenges identified Determined the patient does not have difficulty with medication costs and/or adherence Performed chart review to note the patient participated in an annual wellness visit on 11/09/21 Provided education on the role of the care coordination team - no follow up desired at this time Instructed the patient to contact his primary care provider as needed         SDOH assessments and interventions completed:  Yes  SDOH Interventions Today    Flowsheet Row Most Recent Value  SDOH Interventions   Food Insecurity Interventions Intervention Not Indicated  Housing Interventions Intervention Not Indicated  Transportation Interventions Intervention Not Indicated        Care Coordination Interventions Activated:  Yes  Care Coordination Interventions:  Yes, provided   Follow up plan: No further intervention required.   Encounter Outcome:  Pt. Visit Completed   Daneen Schick, BSW, CDP Social Worker, Certified Dementia Practitioner [place] [facility]  Care Coordination [phone]

## 2022-04-27 DIAGNOSIS — C61 Malignant neoplasm of prostate: Secondary | ICD-10-CM | POA: Diagnosis not present

## 2022-04-27 DIAGNOSIS — N529 Male erectile dysfunction, unspecified: Secondary | ICD-10-CM | POA: Diagnosis not present

## 2022-04-27 DIAGNOSIS — R232 Flushing: Secondary | ICD-10-CM | POA: Diagnosis not present

## 2022-05-31 ENCOUNTER — Telehealth: Payer: Self-pay

## 2022-05-31 NOTE — Telephone Encounter (Signed)
Patient  called and stated that he currently on ELIQUIS and wants to know if it is safe to start a supplement TUMERIC. Please advise.

## 2022-05-31 NOTE — Telephone Encounter (Signed)
Ok. He forgot to ask if he can also take COQ-10 also?

## 2022-05-31 NOTE — Telephone Encounter (Signed)
Patient aware.

## 2022-05-31 NOTE — Telephone Encounter (Signed)
yes

## 2022-07-11 DIAGNOSIS — E7849 Other hyperlipidemia: Secondary | ICD-10-CM | POA: Diagnosis not present

## 2022-07-11 DIAGNOSIS — I10 Essential (primary) hypertension: Secondary | ICD-10-CM | POA: Diagnosis not present

## 2022-07-11 DIAGNOSIS — E1169 Type 2 diabetes mellitus with other specified complication: Secondary | ICD-10-CM | POA: Diagnosis not present

## 2022-07-12 DIAGNOSIS — E1169 Type 2 diabetes mellitus with other specified complication: Secondary | ICD-10-CM | POA: Diagnosis not present

## 2022-07-12 DIAGNOSIS — I10 Essential (primary) hypertension: Secondary | ICD-10-CM | POA: Diagnosis not present

## 2022-07-24 ENCOUNTER — Telehealth: Payer: Self-pay | Admitting: Family Medicine

## 2022-07-24 NOTE — Telephone Encounter (Signed)
Patient left a voicemail on my phone stating that he has been double bill for his supplies. He stated he needs an order sent to [facility] to get his supplies. He stated if you have any questions to please give him a call.

## 2022-07-24 NOTE — Telephone Encounter (Signed)
I have reached out to [facility] for assistance on this.

## 2022-07-24 NOTE — Telephone Encounter (Signed)
Per [facility]: "Pt called in to ask questions about becoming a pt with [facility] and I was able to adv pt to have his doctor to fax over a RX from his doctor.  ( however it looks like we have a copy on file) and supplies were received on 06/26/22.  As for the bill, it was paid but paid before payment come in.   We advised patient to disregard that last bill letter."

## 2022-08-13 DIAGNOSIS — I10 Essential (primary) hypertension: Secondary | ICD-10-CM | POA: Diagnosis not present

## 2022-08-13 DIAGNOSIS — E1169 Type 2 diabetes mellitus with other specified complication: Secondary | ICD-10-CM | POA: Diagnosis not present

## 2022-08-13 DIAGNOSIS — E7849 Other hyperlipidemia: Secondary | ICD-10-CM | POA: Diagnosis not present

## 2022-08-27 ENCOUNTER — Ambulatory Visit: Payer: Medicare HMO | Admitting: Internal Medicine

## 2022-08-27 ENCOUNTER — Encounter: Payer: Self-pay | Admitting: Internal Medicine

## 2022-08-27 VITALS — BP 145/76 | HR 84 | Ht 73.0 in | Wt 233.0 lb

## 2022-08-27 DIAGNOSIS — I10 Essential (primary) hypertension: Secondary | ICD-10-CM

## 2022-08-27 DIAGNOSIS — E782 Mixed hyperlipidemia: Secondary | ICD-10-CM

## 2022-08-27 DIAGNOSIS — I480 Paroxysmal atrial fibrillation: Secondary | ICD-10-CM | POA: Diagnosis not present

## 2022-08-27 NOTE — Progress Notes (Signed)
Patient referred by [name], MD for atrial fibrillation  Subjective:   Gregory Odonnell, male    DOB: Oct 08, 1945, 77 y.o.   MRN: [id number]   Chief Complaint  Patient presents with   Irregular Heart Beat      HPI  77 y.o. African-American male with hypertension, hyperlipidemia, paroxysmal atrial fibrillation, obstructive sleep apnea on CPAP.  History of prostate cancer.  Patient was seen 09/22/2021 with atrial fibrillation. He subsequently underwent direct-current cardioversion with subsequent successful return to normal sinus rhythm on 11/14/2021. He remains in NSR. No issues or side effects to Eliquis. He says that sometimes when he is working out his heart rate is faster but this is normal if he is doing cardio at the gym. Patient feels well and he is still on the treadmill religiously. He does not have any chest pain or shortness of breath with this.  Denies chest pain, dyspnea, dizziness, palpitations.   Current Outpatient Medications on File Prior to Visit  Medication Sig Dispense Refill   amLODipine (NORVASC) 10 MG tablet Take 10 mg by mouth daily.     apixaban (ELIQUIS) 5 MG TABS tablet Take 1 tablet (5 mg total) by mouth 2 (two) times daily. 180 tablet 3   BREO ELLIPTA 100-25 MCG/INH AEPB Inhale 1 puff into the lungs daily as needed (congestion/ wheezing).     FARXIGA 10 MG TABS tablet Take 10 mg by mouth daily.     furosemide (LASIX) 20 MG tablet Take 1 tablet by mouth daily as needed for fluid (swelling or shortness of breath) 30 tablet 0   hydrochlorothiazide (MICROZIDE) 12.5 MG capsule TAKE ONE CAPSULE BY MOUTH ONE TIME DAILY 90 capsule 2   irbesartan-hydrochlorothiazide (AVALIDE) 300-12.5 MG tablet Take 1 tablet by mouth daily.     nebivolol (BYSTOLIC) 10 MG tablet Take 20 mg by mouth daily.     rosuvastatin (CRESTOR) 10 MG tablet Take 10 mg by mouth daily.     No current facility-administered medications on file prior to visit.    Cardiovascular and other  pertinent studies:  EKG 08/27/2022: Sinus bradycardia, RBBB, ST-T wave changes, cannot exclude anterolateral ischemia - no change  EKG 03/26/2022:  Sinus rhythm, RBBB, ST-T wave changes, cannot exclude anterolateral ischemia - no change  EKG 11/21/2021:  Sinus rhythm at a rate of 68 bpm.  Normal axis.  Right bundle branch block with secondary ST-T wave changes, cannot exclude anterolateral ischemia.  Compared to EKG 09/22/2021, now normal sinus rhythm.  Direct current cardioversion 11/14/2021 10:20 AM Indication persistent  A. Fibrillation. Procedure: Using 70 mg of IV Propofol and 40 IV Lidocaine (for reducing venous pain) for achieving deep sedation, synchronized direct current cardioversion performed. Patient was delivered with 120x1 and 150 Joules of electricity X 1 with success to NSR. Patient tolerated the procedure well. No immediate complication noted.   PCV ECHOCARDIOGRAM COMPLETE [id number] Normal LV systolic function with EF 64%. Left ventricle cavity is normal in size. Mild concentric remodeling of the left ventricle. Normal global wall motion. Indeterminate diastolic filling pattern, Indeterminate diastolic filling pattern due to atrial fibrillation. Elevated LAP. Calculated EF 64%. Left atrial cavity is severely dilated at 5.2 cm. Trileaflet aortic valve. No evidence of aortic stenosis. Mild (Grade I) aortic regurgitation. Structurally normal mitral valve. No evidence of mitral stenosis. Mild (Grade I) mitral regurgitation. Structurally normal tricuspid valve. No evidence of tricuspid stenosis. Mild tricuspid regurgitation. Mild pulmonary hypertension. RVSP measures 38 mmHg. Compared to the study done on 04/12/2020, no  significant change.    Lexiscan (Walking with mod Bruce)Tetrofosmin Stress Test  04/11/2020: Nondiagnostic ECG stress. Resting EKG demonstrated normal sinus rhythm. Left QRS axis deviation in the presence of right bundle branch block. Peak EKG revealed no significant  ST-T change from baseline abnormality. Myocardial perfusion is normal. Overall LV systolic function is normal without regional wall motion abnormalities. Stress LV EF: 76%.  No previous exam available for comparison. Low risk.     Recent labs:    Latest Ref Rng & Units 11/14/2021    9:41 AM 03/27/2021    3:00 PM 09/02/2020    9:17 AM  CMP  Glucose 70 - 99 mg/dL 114  98  105   BUN 8 - 23 mg/dL 14  26  14   $ Creatinine 0.61 - 1.24 mg/dL 0.90  1.24  1.11   Sodium 135 - 145 mmol/L 141  140  138   Potassium 3.5 - 5.1 mmol/L 3.5  4.4  4.2   Chloride 98 - 111 mmol/L 106  103  104   CO2 20 - 29 mmol/L  21  18   Calcium 8.6 - 10.2 mg/dL  9.9  9.4       Latest Ref Rng & Units 11/14/2021    9:41 AM 04/16/2018    6:01 AM 04/15/2018    3:45 PM  CBC  WBC 4.0 - 10.5 K/uL  6.2  6.2   Hemoglobin 13.0 - 17.0 g/dL 11.6  11.1  11.3   Hematocrit 39.0 - 52.0 % 34.0  36.5  36.3   Platelets 150 - 400 K/uL  229  238    Lipid Panel  No results found for: "CHOL", "TRIG", "HDL", "CHOLHDL", "VLDL", "LDLCALC", "LDLDIRECT" HEMOGLOBIN A1C No results found for: "HGBA1C", "MPG" TSH No results for input(s): "TSH" in the last 8760 hours.  External labs:  02/22/2021: HDL 55, LDL 84, total cholesterol 155, triglycerides 69 A1c 5.7% BUN 21, creatinine 1.01, GFR >60  09/02/2020: Glucose 105, BUN 14, creatinine 1.11, GFR 65, sodium 130, potassium 4.2  BNP 436  02/01/2020: Glucose 111, BUN/Cr 15/1.09. EGFR 77. Na/K 139/4.1. Rest of the CMP normal H/H 12.9/43.5. MCV 65.6. Platelets 238 HbA1C 5.6% Chol 206, TG 61, HDL 53, LDL 138   Review of Systems  Cardiovascular:  Negative for chest pain, claudication, dyspnea on exertion, leg swelling (stable), near-syncope, orthopnea, palpitations, paroxysmal nocturnal dyspnea and syncope.  Respiratory:  Negative for shortness of breath.   Neurological:  Negative for dizziness and weakness.         Vitals:   08/27/22 1126 08/27/22 1134  BP: (!) 152/76 (!) 145/76   Pulse: 61 84  SpO2: 97%      Body mass index is 30.74 kg/m. Filed Weights   08/27/22 1126  Weight: 233 lb (105.7 kg)     Objective:   Physical Exam Vitals and nursing note reviewed.  Constitutional:      General: He is not in acute distress. HENT:     Head: Normocephalic and atraumatic.  Neck:     Vascular: No carotid bruit or JVD.  Cardiovascular:     Rate and Rhythm: Normal rate and regular rhythm.     Pulses: Normal pulses and intact distal pulses.     Heart sounds: Normal heart sounds, S1 normal and S2 normal. No murmur heard.    No gallop.  Pulmonary:     Effort: Pulmonary effort is normal. No respiratory distress.     Breath sounds: Normal breath sounds. No wheezing,  rhonchi or rales.  Abdominal:     General: Bowel sounds are normal.  Musculoskeletal:     Right lower leg: No edema (minimal).     Left lower leg: No edema (minimal).  Neurological:     Mental Status: He is alert.        Assessment & Recommendations:   77 y.o. African-American male with with hypertension, hyperlipidemia, paroxysmal atrial fibrillation   Paroxysmal atrial fibrillation: Underwent direct-current cardioversion with successful return to normal sinus rhythm.  Patient is maintaining normal sinus rhythm at today's office visit CHA2DS2Vasc score 2, annual stroke risk 2.2%.  His annual stroke risk will only increase as age progresses. Continue eliquis 5 mg bid.  Continue Bystolic, can increase by 73m if he is having palpitations.  Hypertension:  Blood pressure is mildly elevated in the office. At home, all readings are <130/80 mmHg. Increase bystolic by 5 mg for additional BP control Continue amlodipine, hydrochlorothiazide/irbesartan, Bystolic, lasix  Hyperlipidemia: Continue statin therapy     SFloydene Flock DO, [facility]2/06/2023, 12:49 PM Office: [phone]

## 2022-09-03 ENCOUNTER — Other Ambulatory Visit: Payer: Self-pay

## 2022-09-03 MED ORDER — HYDROCHLOROTHIAZIDE 12.5 MG PO CAPS: 12.5000 mg | ORAL_CAPSULE | Freq: Every day | ORAL | 3 refills | Status: DC

## 2022-09-04 DIAGNOSIS — Z08 Encounter for follow-up examination after completed treatment for malignant neoplasm: Secondary | ICD-10-CM | POA: Diagnosis not present

## 2022-09-04 DIAGNOSIS — R8290 Unspecified abnormal findings in urine: Secondary | ICD-10-CM | POA: Diagnosis not present

## 2022-09-04 DIAGNOSIS — Z8546 Personal history of malignant neoplasm of prostate: Secondary | ICD-10-CM | POA: Diagnosis not present

## 2022-09-05 ENCOUNTER — Other Ambulatory Visit: Payer: Self-pay

## 2022-09-05 MED ORDER — HYDROCHLOROTHIAZIDE 12.5 MG PO CAPS: 12.5000 mg | ORAL_CAPSULE | Freq: Every day | ORAL | 3 refills | Status: DC

## 2022-09-24 ENCOUNTER — Ambulatory Visit: Payer: Medicare HMO | Admitting: Internal Medicine

## 2022-10-25 ENCOUNTER — Telehealth: Payer: Self-pay

## 2022-10-25 ENCOUNTER — Other Ambulatory Visit: Payer: Self-pay

## 2022-10-25 DIAGNOSIS — I480 Paroxysmal atrial fibrillation: Secondary | ICD-10-CM

## 2022-10-25 MED ORDER — APIXABAN 5 MG PO TABS: 5.0000 mg | ORAL_TABLET | Freq: Two times a day (BID) | ORAL | 3 refills | Status: DC

## 2022-10-25 NOTE — Telephone Encounter (Signed)
faxed patient assistance for Eliquis 5 mg on 10/25/2022. LM

## 2022-10-26 DIAGNOSIS — Z9079 Acquired absence of other genital organ(s): Secondary | ICD-10-CM | POA: Diagnosis not present

## 2022-10-26 DIAGNOSIS — C61 Malignant neoplasm of prostate: Secondary | ICD-10-CM | POA: Diagnosis not present

## 2022-10-26 DIAGNOSIS — Z79818 Long term (current) use of other agents affecting estrogen receptors and estrogen levels: Secondary | ICD-10-CM | POA: Diagnosis not present

## 2022-10-26 DIAGNOSIS — Z923 Personal history of irradiation: Secondary | ICD-10-CM | POA: Diagnosis not present

## 2022-11-05 DIAGNOSIS — H26492 Other secondary cataract, left eye: Secondary | ICD-10-CM | POA: Diagnosis not present

## 2022-11-05 DIAGNOSIS — H2511 Age-related nuclear cataract, right eye: Secondary | ICD-10-CM | POA: Diagnosis not present

## 2022-11-09 DIAGNOSIS — I10 Essential (primary) hypertension: Secondary | ICD-10-CM | POA: Diagnosis not present

## 2022-11-09 DIAGNOSIS — E1169 Type 2 diabetes mellitus with other specified complication: Secondary | ICD-10-CM | POA: Diagnosis not present

## 2022-11-09 DIAGNOSIS — E7849 Other hyperlipidemia: Secondary | ICD-10-CM | POA: Diagnosis not present

## 2022-11-12 DIAGNOSIS — E782 Mixed hyperlipidemia: Secondary | ICD-10-CM | POA: Diagnosis not present

## 2022-11-12 DIAGNOSIS — E1169 Type 2 diabetes mellitus with other specified complication: Secondary | ICD-10-CM | POA: Diagnosis not present

## 2022-11-12 DIAGNOSIS — E639 Nutritional deficiency, unspecified: Secondary | ICD-10-CM | POA: Diagnosis not present

## 2023-02-11 DIAGNOSIS — I4891 Unspecified atrial fibrillation: Secondary | ICD-10-CM | POA: Diagnosis not present

## 2023-02-11 DIAGNOSIS — R609 Edema, unspecified: Secondary | ICD-10-CM | POA: Diagnosis not present

## 2023-02-11 DIAGNOSIS — Z6831 Body mass index (BMI) 31.0-31.9, adult: Secondary | ICD-10-CM | POA: Diagnosis not present

## 2023-02-11 DIAGNOSIS — E7849 Other hyperlipidemia: Secondary | ICD-10-CM | POA: Diagnosis not present

## 2023-02-11 DIAGNOSIS — I10 Essential (primary) hypertension: Secondary | ICD-10-CM | POA: Diagnosis not present

## 2023-02-11 DIAGNOSIS — I110 Hypertensive heart disease with heart failure: Secondary | ICD-10-CM | POA: Diagnosis not present

## 2023-02-11 DIAGNOSIS — E1169 Type 2 diabetes mellitus with other specified complication: Secondary | ICD-10-CM | POA: Diagnosis not present

## 2023-02-25 ENCOUNTER — Ambulatory Visit: Payer: Medicare HMO | Admitting: Cardiology

## 2023-03-07 ENCOUNTER — Telehealth: Payer: Self-pay | Admitting: *Deleted

## 2023-03-07 NOTE — Telephone Encounter (Signed)
Patient informed to bring cpap machine and power cord to visit on 03/11/23. Pt verbalized he understood.

## 2023-03-07 NOTE — Patient Instructions (Addendum)
Please continue using your CPAP regularly. While your insurance requires that you use CPAP at least 4 hours each night on 70% of the nights, I recommend, that you not skip any nights and use it throughout the night if you can. Getting used to CPAP and staying with the treatment long term does take time and patience and discipline. Untreated obstructive sleep apnea when it is moderate to severe can have an adverse impact on cardiovascular health and raise her risk for heart disease, arrhythmias, hypertension, congestive heart failure, stroke and diabetes. Untreated obstructive sleep apnea causes sleep disruption, nonrestorative sleep, and sleep deprivation. This can have an impact on your day to day functioning and cause daytime sleepiness and impairment of cognitive function, memory loss, mood disturbance, and problems focussing. Using CPAP regularly can improve these symptoms.  We will update supply orders, today. Please bring your card this week and we can download the report.   Follow up in 1 year

## 2023-03-07 NOTE — Progress Notes (Signed)
PATIENT: Gregory Odonnell DOB: [birth year]  REASON FOR VISIT: follow up HISTORY FROM: patient  Chief Complaint  Patient presents with   Follow-up    CPAP     HISTORY OF PRESENT ILLNESS:  03/11/23 ALL:  Gregory Odonnell returns for follow up for OSA on CPAP. He continues to do well on therapy. He reports using CPAP every night but does not always leave it on all night. He feels he gets 4-5 hours of usage, on average. He denies difficulty using therapy or trouble with supplies. He usually wakes to use the restroom then discontinues therapy. He is using FFM. He denies concerns, today.   03/15/2022 ALL: Gregory Odonnell returns for OSA on CPAP. He continues to do well on CPAP. He is using CPAP nightly for about 6 hours. He denies concerns with his machine or supplies.     03/15/2021 ALL: Gregory Odonnell is a 77 y.o. male here today for follow up for OSA on CPAP.  HST 06/27/2020 showed moderate OSA with total AHI of 18.1/hr and O2 nadir of 89%. AutoPAP was ordered. He has used CPAP daily since being set up in 12/2020. He does feel that he is sleeping better. He feels more energized and better rested. He denies concerns with machine. He has noted a leak with his nasal mask. He is using a medium nasal mask and feels it is comfortable and fits well. He sleeps on his side and thinks leak if from mask getting shifted. He is monitoring at home and adjusting headgear.     HISTORY: (copied from Dr Teofilo Pod previous note)  Dear Dr. Rosemary Holms,    I saw your patient, Gregory Odonnell, upon your kind request in my sleep clinic today for initial consultation of his sleep disorder, in particular, concern for underlying obstructive sleep apnea.  The patient is unaccompanied today.  As you know, Gregory. Cancel is a 77 year old right-handed gentleman with an underlying medical history of paroxysmal A. fib, prostate cancer, hypertension, diverticulosis, arthritis, seasonal asthma, and mild obesity, who reports snoring  and some difficulty with sleep initiation and sleep maintenance.  He was diagnosed with paroxysmal A. fib not too long ago, it was in the context of preparing for cataract surgery.  He does not have any significant symptoms such as shortness of breath or palpitations or chest pains.  Years ago his wife had voiced concern that he may have pauses in his breathing.  She has not complained about it lately.  They have been married 44 years.  They have 1 grown daughter, age 27 and 1 grandson, age 31, 5 grandchildren.  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  He is a non-smoker and drinks no alcohol, limited caffeine in the form of coffee, 2 cups/day on average.  He typically goes to bed around 10 or 11.  He denies recurrent morning headaches but has nocturia about twice per average night, no obvious or known family history of sleep apnea.  His snoring can be loud and disturbing to his wife but will fluctuate. I reviewed your office note from 05/11/2020.  His Epworth sleepiness score is 2 out of 24, fatigue severity score is 11 out of 63.   REVIEW OF SYSTEMS: Out of a complete 14 system review of symptoms, the patient complains only of the following symptoms, none and all other reviewed systems are negative.   ALLERGIES: No Known Allergies  HOME MEDICATIONS: Outpatient Medications Prior to Visit  Medication Sig Dispense Refill   amLODipine (  NORVASC) 10 MG tablet Take 10 mg by mouth daily.     apixaban (ELIQUIS) 5 MG TABS tablet Take 1 tablet (5 mg total) by mouth 2 (two) times daily. 180 tablet 3   BREO ELLIPTA 100-25 MCG/INH AEPB Inhale 1 puff into the lungs daily as needed (congestion/ wheezing).     FARXIGA 10 MG TABS tablet Take 10 mg by mouth daily.     furosemide (LASIX) 20 MG tablet Take 1 tablet by mouth daily as needed for fluid (swelling or shortness of breath) 30 tablet 0   hydrochlorothiazide (MICROZIDE) 12.5 MG capsule Take 1 capsule (12.5 mg total) by mouth daily. 90 capsule 3    irbesartan-hydrochlorothiazide (AVALIDE) 300-12.5 MG tablet Take 1 tablet by mouth daily.     nebivolol (BYSTOLIC) 10 MG tablet Take 20 mg by mouth daily.     rosuvastatin (CRESTOR) 10 MG tablet Take 10 mg by mouth daily.     No facility-administered medications prior to visit.    PAST MEDICAL HISTORY: Past Medical History:  Diagnosis Date   Arthritis    "maybe in my shoulders and one of my fingers on right hands" (11/24/2015)   Diverticulosis    Hypertension    Pancreatic lesion    routinely followed by [facility]   Prostate cancer [facility])    prostate w/ ?mets to bladder   Seasonal asthma    "sometimes in May"    PAST SURGICAL HISTORY: Past Surgical History:  Procedure Laterality Date   BACK SURGERY     BLADDER SURGERY     CARDIOVERSION N/A 11/14/2021   Procedure: CARDIOVERSION;  Surgeon: Yates Decamp, MD;  Location: [facility] ENDOSCOPY;  Service: Cardiovascular;  Laterality: N/A;   LUMBAR DISC SURGERY  1988   "herniated disc"   PROSTATECTOMY  2011   TONSILLECTOMY  1950s    FAMILY HISTORY: Family History  Problem Relation Age of Onset   Cancer Father        prostate   Cancer Brother     SOCIAL HISTORY: Social History   Socioeconomic History   Marital status: Married    Spouse name: Not on file   Number of children: 2   Years of education: Not on file   Highest education level: Not on file  Occupational History   Occupation: retired  Tobacco Use   Smoking status: Never   Smokeless tobacco: Never  Vaping Use   Vaping status: Never Used  Substance and Sexual Activity   Alcohol use: Yes    Comment: 11/24/2015 "I drink once q 6-7 months"   Drug use: Not Currently    Types: Marijuana    Comment: "back in the 1970s only"   Sexual activity: Not Currently  Other Topics Concern   Not on file  Social History Narrative   Not on file   Social Determinants of Health   Financial Resource Strain: Not on file  Food Insecurity: No Food Insecurity (04/18/2022)   Hunger  Vital Sign    Worried About Running Out of Food in the Last Year: Never true    Ran Out of Food in the Last Year: Never true  Transportation Needs: No Transportation Needs (04/18/2022)   PRAPARE - Administrator, Civil Service (Medical): No    Lack of Transportation (Non-Medical): No  Physical Activity: Not on file  Stress: Not on file  Social Connections: Not on file  Intimate Partner Violence: Not on file     PHYSICAL EXAM  Vitals:   03/11/23  1448  BP: (!) 146/69  Pulse: 68  Weight: 232 lb 9.6 oz (105.5 kg)  Height: 6\' 1"  (1.854 m)     Body mass index is 30.69 kg/m.  Generalized: Well developed, in no acute distress  Cardiology: normal rate and rhythm, no murmur noted Respiratory: clear to auscultation bilaterally  Neurological examination  Mentation: Alert oriented to time, place, history taking. Follows all commands speech and language fluent Cranial nerve II-XII: Pupils were equal round reactive to light. Extraocular movements were full, visual field were full  Motor: The motor testing reveals 5 over 5 strength of all 4 extremities. Good symmetric motor tone is noted throughout.  Gait and station: Gait is normal.    DIAGNOSTIC DATA (LABS, IMAGING, TESTING) - I reviewed patient records, labs, notes, testing and imaging myself where available.      No data to display           Lab Results  Component Value Date   WBC 6.2 04/16/2018   HGB 11.6 (L) 11/14/2021   HCT 34.0 (L) 11/14/2021   MCV 63.9 (L) 04/16/2018   PLT 229 04/16/2018      Component Value Date/Time   NA 141 11/14/2021 0941   NA 140 03/27/2021 1500   K 3.5 11/14/2021 0941   CL 106 11/14/2021 0941   CO2 21 03/27/2021 1500   GLUCOSE 114 (H) 11/14/2021 0941   BUN 14 11/14/2021 0941   BUN 26 03/27/2021 1500   CREATININE 0.90 11/14/2021 0941   CALCIUM 9.9 03/27/2021 1500   PROT 6.5 04/15/2018 0807   ALBUMIN 3.5 04/15/2018 0807   AST 27 04/15/2018 0807   ALT 22 04/15/2018 0807    ALKPHOS 43 04/15/2018 0807   BILITOT 0.7 04/15/2018 0807   GFRNONAA 65 09/02/2020 0917   GFRAA 75 09/02/2020 0917   No results found for: "CHOL", "HDL", "LDLCALC", "LDLDIRECT", "TRIG", "CHOLHDL" No results found for: "HGBA1C" No results found for: "VITAMINB12" No results found for: "TSH"   ASSESSMENT AND PLAN 77 y.o. year old male  has a past medical history of Arthritis, Diverticulosis, Hypertension, Pancreatic lesion, Prostate cancer (HCC), and Seasonal asthma. here with     ICD-10-CM   1. Obstructive sleep apnea on CPAP  G47.33 For home use only DME continuous positive airway pressure (CPAP)       AVRUMY CAMILLI is doing well on CPAP therapy. Compliance report unavailable, today. He will return this week once Epic is restored to download report. He was encouraged to continue using CPAP nightly and for greater than 4 hours each night. He will continue to monitor for leak at home. Leak has improved. Risks of untreated sleep apnea review and education materials provided. Healthy lifestyle habits encouraged. He will follow up in 1 year, sooner if needed. He verbalizes understanding and agreement with this plan.    Orders Placed This Encounter  Procedures   For home use only DME continuous positive airway pressure (CPAP)    Supplies    Order Specific Question:   Length of Need    Answer:   Lifetime    Order Specific Question:   Patient has OSA or probable OSA    Answer:   Yes    Order Specific Question:   Is the patient currently using CPAP in the home    Answer:   Yes    Order Specific Question:   Settings    Answer:   Other see comments    Order Specific Question:   CPAP supplies  needed    Answer:   Mask, headgear, cushions, filters, heated tubing and water chamber      No orders of the defined types were placed in this encounter.     Shawnie Dapper, FNP-C 03/11/2023, 2:48 PM [facility] [address] [place], [place] [address] [phone]

## 2023-03-11 ENCOUNTER — Encounter: Payer: Self-pay | Admitting: Family Medicine

## 2023-03-11 ENCOUNTER — Ambulatory Visit: Payer: Medicare HMO | Admitting: Family Medicine

## 2023-03-11 VITALS — BP 146/69 | HR 68 | Ht 73.0 in | Wt 232.6 lb

## 2023-03-11 DIAGNOSIS — G4733 Obstructive sleep apnea (adult) (pediatric): Secondary | ICD-10-CM | POA: Diagnosis not present

## 2023-03-14 ENCOUNTER — Ambulatory Visit: Payer: Medicare HMO | Admitting: Cardiology

## 2023-03-14 ENCOUNTER — Encounter: Payer: Self-pay | Admitting: Cardiology

## 2023-03-14 VITALS — BP 155/70 | HR 64 | Resp 16 | Ht 73.0 in | Wt 233.0 lb

## 2023-03-14 DIAGNOSIS — I10 Essential (primary) hypertension: Secondary | ICD-10-CM | POA: Diagnosis not present

## 2023-03-14 DIAGNOSIS — E782 Mixed hyperlipidemia: Secondary | ICD-10-CM

## 2023-03-14 DIAGNOSIS — I480 Paroxysmal atrial fibrillation: Secondary | ICD-10-CM | POA: Diagnosis not present

## 2023-03-14 DIAGNOSIS — R600 Localized edema: Secondary | ICD-10-CM

## 2023-03-14 NOTE — Progress Notes (Signed)
Patient referred by [name], MD for atrial fibrillation  Subjective:   Gregory Odonnell, male    DOB: April 02, 1946, 77 y.o.   MRN: [id number]   Chief Complaint  Patient presents with   Atrial Fibrillation   Follow-up    6 month     HPI  77 y.o. African-American male with hypertension, paroxysmal atrial fibrillation  I last saw the patient in 2021. He has since been seen by other providers. He is doing well. He denies chest pain, shortness of breath, palpitations, orthopnea, PND, TIA/syncope. On specific questioning, he has had leg edema. He is compliant with his medical therapy. Blood pressure elevated today but generally very well controlled.    Initial consultation HPI 03/2020: Patient is retired Museum/gallery conservator.  He has at longstanding hypertension, but denies any other medical comorbidities.  He was incidentally found to be in atrial fibrillation during his cataract surgery on September 1.  He since then followed up with his PCP, where he was found to be in sinus rhythm.  Patient denies any complaints of chest pain, shortness of breath.  That said, his physical activity is limited.  He used to snore in the past.  He has been tolerating Eliquis well.  Blood pressure is elevated today.  Reports home blood pressure to be around 130/70 mmHg.   Current Outpatient Medications:    amLODipine (NORVASC) 10 MG tablet, Take 10 mg by mouth daily., Disp: , Rfl:    apixaban (ELIQUIS) 5 MG TABS tablet, Take 1 tablet (5 mg total) by mouth 2 (two) times daily., Disp: 180 tablet, Rfl: 3   BREO ELLIPTA 100-25 MCG/INH AEPB, Inhale 1 puff into the lungs daily as needed (congestion/ wheezing)., Disp: , Rfl:    FARXIGA 10 MG TABS tablet, Take 10 mg by mouth daily., Disp: , Rfl:    furosemide (LASIX) 20 MG tablet, Take 1 tablet by mouth daily as needed for fluid (swelling or shortness of breath), Disp: 30 tablet, Rfl: 0   hydrochlorothiazide (MICROZIDE) 12.5 MG capsule, Take 1 capsule (12.5  mg total) by mouth daily., Disp: 90 capsule, Rfl: 3   irbesartan-hydrochlorothiazide (AVALIDE) 300-12.5 MG tablet, Take 1 tablet by mouth daily., Disp: , Rfl:    nebivolol (BYSTOLIC) 10 MG tablet, Take 20 mg by mouth daily., Disp: , Rfl:    rosuvastatin (CRESTOR) 10 MG tablet, Take 10 mg by mouth daily., Disp: , Rfl:    Cardiovascular and other pertinent studies:  EKG 03/14/2023: Sinus rhythm 66 bpm RBBB Occasional PAC   Anterolateral TWI, consider ischemia No change compared to previous EKG  Echocardiogram 04/06/2021:  Normal LV systolic function with EF 64%. Left ventricle cavity is normal  in size. Mild concentric remodeling of the left ventricle. Normal global  wall motion. Indeterminate diastolic filling pattern, Indeterminate  diastolic filling pattern due to atrial fibrillation. Elevated LAP.  Left atrial cavity is severely dilated at 5.2 cm.  Trileaflet aortic valve. No evidence of aortic stenosis. Mild (Grade I)  aortic regurgitation.  Structurally normal mitral valve. No evidence of mitral stenosis. Mild  (Grade I) mitral regurgitation.  Structurally normal tricuspid valve. No evidence of tricuspid stenosis.  Mild tricuspid regurgitation. Mild pulmonary hypertension. RVSP measures  38 mmHg.  Compared to the study done on 04/12/2020, no significant change.   Lexiscan (Walking with mod Bruce)Tetrofosmin Stress Test  04/11/2020: Nondiagnostic ECG stress. Resting EKG demonstrated normal sinus rhythm. Left QRS axis deviation in the presence of right bundle branch block. Peak EKG revealed  no significant ST-T change from baseline abnormality. Myocardial perfusion is normal. Overall LV systolic function is normal without regional wall motion abnormalities. Stress LV EF: 76%.  No previous exam available for comparison. Low risk.   Recent labs: 02/11/2023: Glucose 91, BUN/Cr 19/1.0. EGFR 61. K 4.3 Hb 10.7 HbA1C 6.2% Chol 137, TG 53, HDL 57, LDL 67 TSH NA  02/01/2020: Glucose  111, BUN/Cr 15/1.09. EGFR 77. Na/K 139/4.1. Rest of the CMP normal H/H 12.9/43.5. MCV 65.6. Platelets 238 HbA1C 5.6% Chol 206, TG 61, HDL 53, LDL 138   Review of Systems  Cardiovascular:  Negative for chest pain, dyspnea on exertion, leg swelling, palpitations and syncope.         Vitals:   03/14/23 1505 03/14/23 1508  BP: (!) 158/76 (!) 155/70  Pulse: 66 64  Resp: 16   SpO2: 97%       Body mass index is 30.74 kg/m. Filed Weights   03/14/23 1505  Weight: 233 lb (105.7 kg)      Objective:   Physical Exam Vitals and nursing note reviewed.  Constitutional:      General: He is not in acute distress. Neck:     Vascular: No JVD.  Cardiovascular:     Rate and Rhythm: Normal rate and regular rhythm.     Heart sounds: Murmur heard.     High-pitched blowing holosystolic murmur is present with a grade of 2/6 at the apex.  Pulmonary:     Effort: Pulmonary effort is normal.     Breath sounds: Normal breath sounds. No wheezing or rales.  Musculoskeletal:     Right lower leg: Edema (1+) present.     Left lower leg: Edema (1+) present.          Assessment & Recommendations:   77 y.o. African-American male with hypertension, paroxysmal atrial fibrillation.  Paroxysmal atrial fibrillation: Currently in sinus rhythm. CHA2DS2Vasc score 2, annual stroke risk 2.2%. Continue Eliquis 5 mg bid. Prior TR. Will check echocardiogram.  Hypertension: Generally well controlled at home. No change made today.  Leg edema: Check proBNP. Wear compression stockings.  Mixed hyperlipidemia: Well controlled.  Continue Cresotr 10 mg.   F/u in 6 months   Elder Negus, MD Pager: [phone] Office: [phone]

## 2023-03-19 DIAGNOSIS — R600 Localized edema: Secondary | ICD-10-CM | POA: Diagnosis not present

## 2023-03-20 ENCOUNTER — Telehealth: Payer: Self-pay | Admitting: *Deleted

## 2023-03-20 LAB — PRO B NATRIURETIC PEPTIDE: NT-Pro BNP: 606 pg/mL — ABNORMAL HIGH (ref 0–486)

## 2023-03-20 MED ORDER — FUROSEMIDE 20 MG PO TABS: 20.0000 mg | ORAL_TABLET | ORAL | 3 refills | Status: DC | PRN

## 2023-03-20 NOTE — Telephone Encounter (Signed)
Pt brought in sd card.  Download complete.

## 2023-03-20 NOTE — Addendum Note (Signed)
Addended by: Elder Negus on: 03/20/2023 04:52 PM   Modules accepted: Orders

## 2023-03-27 ENCOUNTER — Other Ambulatory Visit: Payer: Self-pay | Admitting: Cardiology

## 2023-03-27 ENCOUNTER — Telehealth: Payer: Self-pay

## 2023-03-27 DIAGNOSIS — I480 Paroxysmal atrial fibrillation: Secondary | ICD-10-CM

## 2023-03-27 MED ORDER — DILTIAZEM HCL ER COATED BEADS 120 MG PO CP24: 120.0000 mg | ORAL_CAPSULE | Freq: Every day | ORAL | 3 refills | Status: DC

## 2023-03-27 NOTE — Telephone Encounter (Signed)
Recommend adding diltiazem 120 mg daily. I believe this medication should help his symptoms.  Also recommend 2 week monitor to assess Afib burden. When can it be placed? If symptoms no better after the above changes, could be seen in urgent care. Otherwise, please make an appt w/me in 4-6 weeks for PAF.  Thanks MJP

## 2023-03-27 NOTE — Telephone Encounter (Signed)
Patient called and stated that for the past few nights, he has been feeling his heart racing and can't sleep. Patient denies chest pain ,shortness, of breath, dizziness, or swelling. Patient has been checking his BP's and they have been fine. Patient wants to know if he can be seen this week or does he ned to go to ED. Please advise.

## 2023-03-28 ENCOUNTER — Telehealth: Payer: Self-pay | Admitting: *Deleted

## 2023-03-28 ENCOUNTER — Ambulatory Visit: Payer: PRIVATE HEALTH INSURANCE | Attending: Cardiology

## 2023-03-28 DIAGNOSIS — I480 Paroxysmal atrial fibrillation: Secondary | ICD-10-CM

## 2023-03-28 NOTE — Telephone Encounter (Signed)
Calling to schedule appointment to have ZIO XT monitor ordered by Dr. Rosemary Holms, applied at [facility] , [address], [place].  Please contact Saidee Geremia/ Monitors at [phone] to schedule.

## 2023-03-28 NOTE — Progress Notes (Unsigned)
ZIO XT serial # [id number] from office inventory applied to patient.  Dr. Rosemary Holms to read.

## 2023-04-02 NOTE — Telephone Encounter (Signed)
Patient understands.

## 2023-04-13 DIAGNOSIS — I480 Paroxysmal atrial fibrillation: Secondary | ICD-10-CM | POA: Diagnosis not present

## 2023-04-16 ENCOUNTER — Encounter (HOSPITAL_COMMUNITY): Payer: Self-pay

## 2023-04-16 DIAGNOSIS — I480 Paroxysmal atrial fibrillation: Secondary | ICD-10-CM | POA: Diagnosis not present

## 2023-04-23 ENCOUNTER — Other Ambulatory Visit: Payer: Medicare HMO

## 2023-04-26 DIAGNOSIS — Z923 Personal history of irradiation: Secondary | ICD-10-CM | POA: Diagnosis not present

## 2023-04-26 DIAGNOSIS — Z9079 Acquired absence of other genital organ(s): Secondary | ICD-10-CM | POA: Diagnosis not present

## 2023-04-26 DIAGNOSIS — E349 Endocrine disorder, unspecified: Secondary | ICD-10-CM | POA: Diagnosis not present

## 2023-04-26 DIAGNOSIS — C61 Malignant neoplasm of prostate: Secondary | ICD-10-CM | POA: Diagnosis not present

## 2023-04-26 DIAGNOSIS — R399 Unspecified symptoms and signs involving the genitourinary system: Secondary | ICD-10-CM | POA: Diagnosis not present

## 2023-04-26 DIAGNOSIS — Z9229 Personal history of other drug therapy: Secondary | ICD-10-CM | POA: Diagnosis not present

## 2023-05-01 DIAGNOSIS — K862 Cyst of pancreas: Secondary | ICD-10-CM | POA: Diagnosis not present

## 2023-05-13 DIAGNOSIS — I480 Paroxysmal atrial fibrillation: Secondary | ICD-10-CM | POA: Diagnosis not present

## 2023-05-13 DIAGNOSIS — Z6831 Body mass index (BMI) 31.0-31.9, adult: Secondary | ICD-10-CM | POA: Diagnosis not present

## 2023-05-13 DIAGNOSIS — R609 Edema, unspecified: Secondary | ICD-10-CM | POA: Diagnosis not present

## 2023-05-13 DIAGNOSIS — R739 Hyperglycemia, unspecified: Secondary | ICD-10-CM | POA: Diagnosis not present

## 2023-05-13 DIAGNOSIS — F064 Anxiety disorder due to known physiological condition: Secondary | ICD-10-CM | POA: Diagnosis not present

## 2023-05-13 DIAGNOSIS — E7849 Other hyperlipidemia: Secondary | ICD-10-CM | POA: Diagnosis not present

## 2023-05-13 DIAGNOSIS — I10 Essential (primary) hypertension: Secondary | ICD-10-CM | POA: Diagnosis not present

## 2023-05-13 DIAGNOSIS — E782 Mixed hyperlipidemia: Secondary | ICD-10-CM | POA: Diagnosis not present

## 2023-05-13 DIAGNOSIS — Z0000 Encounter for general adult medical examination without abnormal findings: Secondary | ICD-10-CM | POA: Diagnosis not present

## 2023-05-13 DIAGNOSIS — I4891 Unspecified atrial fibrillation: Secondary | ICD-10-CM | POA: Diagnosis not present

## 2023-05-29 ENCOUNTER — Emergency Department (HOSPITAL_COMMUNITY): Payer: Medicare HMO

## 2023-05-29 ENCOUNTER — Other Ambulatory Visit: Payer: Self-pay

## 2023-05-29 ENCOUNTER — Emergency Department (HOSPITAL_COMMUNITY)
Admission: EM | Admit: 2023-05-29 | Discharge: 2023-05-29 | Disposition: A | Discharge status: Home / Self Care | Payer: Medicare HMO | Attending: Emergency Medicine | Admitting: Emergency Medicine

## 2023-05-29 DIAGNOSIS — K4090 Unilateral inguinal hernia, without obstruction or gangrene, not specified as recurrent: Secondary | ICD-10-CM | POA: Diagnosis not present

## 2023-05-29 DIAGNOSIS — R42 Dizziness and giddiness: Secondary | ICD-10-CM | POA: Insufficient documentation

## 2023-05-29 DIAGNOSIS — Z8546 Personal history of malignant neoplasm of prostate: Secondary | ICD-10-CM | POA: Insufficient documentation

## 2023-05-29 DIAGNOSIS — R310 Gross hematuria: Secondary | ICD-10-CM | POA: Insufficient documentation

## 2023-05-29 DIAGNOSIS — Z79899 Other long term (current) drug therapy: Secondary | ICD-10-CM | POA: Insufficient documentation

## 2023-05-29 DIAGNOSIS — Z7901 Long term (current) use of anticoagulants: Secondary | ICD-10-CM | POA: Insufficient documentation

## 2023-05-29 DIAGNOSIS — R319 Hematuria, unspecified: Secondary | ICD-10-CM | POA: Diagnosis not present

## 2023-05-29 DIAGNOSIS — I4891 Unspecified atrial fibrillation: Secondary | ICD-10-CM | POA: Diagnosis not present

## 2023-05-29 DIAGNOSIS — K5750 Diverticulosis of both small and large intestine without perforation or abscess without bleeding: Secondary | ICD-10-CM | POA: Diagnosis not present

## 2023-05-29 DIAGNOSIS — I10 Essential (primary) hypertension: Secondary | ICD-10-CM | POA: Insufficient documentation

## 2023-05-29 DIAGNOSIS — N281 Cyst of kidney, acquired: Secondary | ICD-10-CM | POA: Diagnosis not present

## 2023-05-29 LAB — CBC WITH DIFFERENTIAL/PLATELET
Abs Immature Granulocytes: 0.02 10*3/uL (ref 0.00–0.07)
Basophils Absolute: 0 10*3/uL (ref 0.0–0.1)
Basophils Relative: 1 %
Eosinophils Absolute: 0.2 10*3/uL (ref 0.0–0.5)
Eosinophils Relative: 4 %
HCT: 37.6 % — ABNORMAL LOW (ref 39.0–52.0)
Hemoglobin: 11.7 g/dL — ABNORMAL LOW (ref 13.0–17.0)
Immature Granulocytes: 0 %
Lymphocytes Relative: 22 %
Lymphs Abs: 1.2 10*3/uL (ref 0.7–4.0)
MCH: 19 pg — ABNORMAL LOW (ref 26.0–34.0)
MCHC: 31.1 g/dL (ref 30.0–36.0)
MCV: 61.1 fL — ABNORMAL LOW (ref 80.0–100.0)
Monocytes Absolute: 0.4 10*3/uL (ref 0.1–1.0)
Monocytes Relative: 8 %
Neutro Abs: 3.5 10*3/uL (ref 1.7–7.7)
Neutrophils Relative %: 65 %
Platelets: 190 10*3/uL (ref 150–400)
RBC: 6.15 MIL/uL — ABNORMAL HIGH (ref 4.22–5.81)
RDW: 18.6 % — ABNORMAL HIGH (ref 11.5–15.5)
WBC: 5.4 10*3/uL (ref 4.0–10.5)
nRBC: 0.4 % — ABNORMAL HIGH (ref 0.0–0.2)

## 2023-05-29 LAB — COMPREHENSIVE METABOLIC PANEL
ALT: 14 U/L (ref 0–44)
AST: 20 U/L (ref 15–41)
Albumin: 3.6 g/dL (ref 3.5–5.0)
Alkaline Phosphatase: 47 U/L (ref 38–126)
Anion gap: 8 (ref 5–15)
BUN: 15 mg/dL (ref 8–23)
CO2: 25 mmol/L (ref 22–32)
Calcium: 9.6 mg/dL (ref 8.9–10.3)
Chloride: 106 mmol/L (ref 98–111)
Creatinine, Ser: 1.15 mg/dL (ref 0.61–1.24)
GFR, Estimated: >60 mL/min (ref >60)
Glucose, Bld: 113 mg/dL — ABNORMAL HIGH (ref 70–99)
Potassium: 3.5 mmol/L (ref 3.5–5.1)
Sodium: 139 mmol/L (ref 135–145)
Total Bilirubin: 0.9 mg/dL (ref <1.2)
Total Protein: 6.9 g/dL (ref 6.5–8.1)

## 2023-05-29 LAB — PROTIME-INR
INR: 1.2 (ref 0.8–1.2)
Prothrombin Time: 14.9 s (ref 11.4–15.2)

## 2023-05-29 MED ORDER — LACTATED RINGERS IV BOLUS
1000.0000 mL | Freq: Once | INTRAVENOUS | Status: AC
  Administered 2023-05-29: 1000 mL via INTRAVENOUS

## 2023-05-29 MED ORDER — IOPAMIDOL (ISOVUE-370) INJECTION 76%
75.0000 mL | Freq: Once | INTRAVENOUS | Status: AC | PRN
  Administered 2023-05-29: 75 mL via INTRAVENOUS

## 2023-05-29 NOTE — ED Provider Notes (Signed)
[facility] EMERGENCY DEPARTMENT AT [facility] Provider Note   CSN: [id number] Arrival date & time: 05/29/23  0544     History  Chief Complaint  Patient presents with   Hematuria    Gregory Odonnell is a 77 y.o. male.   Hematuria  Patient presents for hematuria.  Medical history includes prostate cancer, HTN, GI bleed, anemia, atrial fibrillation, HLD.  He is prescribed Eliquis.  He is followed by [facility] urology.  Onset of hematuria was yesterday.  He describes bright red blood and clots present in urine.  He denies any associated pain.  He has had some mild lightheadedness.     Home Medications Prior to Admission medications   Medication Sig Start Date End Date Taking? Authorizing Provider  amLODipine (NORVASC) 10 MG tablet Take 10 mg by mouth daily. 02/22/14   [provider]  apixaban (ELIQUIS) 5 MG TABS tablet Take 1 tablet (5 mg total) by mouth 2 (two) times daily. 10/25/22   Patwardhan, Anabel Bene, MD  diltiazem (CARDIZEM CD) 120 MG 24 hr capsule Take 1 capsule (120 mg total) by mouth daily. 03/27/23 06/25/23  Patwardhan, Anabel Bene, MD  FARXIGA 10 MG TABS tablet Take 10 mg by mouth daily. 07/23/22   [provider]  furosemide (LASIX) 20 MG tablet Take 1 tablet (20 mg total) by mouth as needed. 03/20/23   Patwardhan, Anabel Bene, MD  hydrochlorothiazide (MICROZIDE) 12.5 MG capsule Take 1 capsule (12.5 mg total) by mouth daily. 09/05/22   Custovic, Rozell Searing, DO  irbesartan-hydrochlorothiazide (AVALIDE) 300-12.5 MG tablet Take 1 tablet by mouth daily.    [provider]  montelukast (SINGULAIR) 10 MG tablet Take 10 mg by mouth daily.    [provider]  nebivolol (BYSTOLIC) 10 MG tablet Take 20 mg by mouth daily. 04/21/19   [provider]  rosuvastatin (CRESTOR) 10 MG tablet Take 10 mg by mouth daily. 04/27/20   [provider]      Allergies    Patient has no known allergies.    Review of Systems   Review of  Systems  Genitourinary:  Positive for hematuria.  Neurological:  Positive for light-headedness.  All other systems reviewed and are negative.   Physical Exam Updated Vital Signs BP (!) 145/71 (BP Location: Right Arm)   Pulse 77   Temp 97.9 F (36.6 C)   Resp 18   SpO2 97%  Physical Exam Vitals and nursing note reviewed.  Constitutional:      General: He is not in acute distress.    Appearance: Normal appearance. He is well-developed. He is not ill-appearing, toxic-appearing or diaphoretic.  HENT:     Head: Normocephalic and atraumatic.     Right Ear: External ear normal.     Left Ear: External ear normal.     Nose: Nose normal.     Mouth/Throat:     Mouth: Mucous membranes are moist.  Eyes:     Extraocular Movements: Extraocular movements intact.     Conjunctiva/sclera: Conjunctivae normal.  Cardiovascular:     Rate and Rhythm: Normal rate and regular rhythm.  Pulmonary:     Effort: Pulmonary effort is normal. No respiratory distress.  Abdominal:     General: There is no distension.     Palpations: Abdomen is soft.     Tenderness: There is no abdominal tenderness.     Comments: No ladder wall abnormalities or echogenic layering material in bladder on bedside ultrasound.  Musculoskeletal:  General: No swelling. Normal range of motion.     Cervical back: Normal range of motion and neck supple.  Skin:    General: Skin is warm and dry.     Coloration: Skin is not jaundiced or pale.  Neurological:     General: No focal deficit present.     Mental Status: He is alert and oriented to person, place, and time.  Psychiatric:        Mood and Affect: Mood normal.        Behavior: Behavior normal.     ED Results / Procedures / Treatments   Labs (all labs ordered are listed, but only abnormal results are displayed) Labs Reviewed  CBC WITH DIFFERENTIAL/PLATELET - Abnormal; Notable for the following components:      Result Value   RBC 6.15 (*)    Hemoglobin 11.7 (*)     HCT 37.6 (*)    MCV 61.1 (*)    MCH 19.0 (*)    RDW 18.6 (*)    nRBC 0.4 (*)    All other components within normal limits  COMPREHENSIVE METABOLIC PANEL - Abnormal; Notable for the following components:   Glucose, Bld 113 (*)    All other components within normal limits  PROTIME-INR    EKG None  Radiology No results found.  Procedures Procedures    Medications Ordered in ED Medications  lactated ringers bolus 1,000 mL (1,000 mLs Intravenous New Bag/Given 05/29/23 0734)    ED Course/ Medical Decision Making/ A&P                                 Medical Decision Making Amount and/or Complexity of Data Reviewed Labs: ordered. Radiology: ordered.   This patient presents to the ED for concern of hematuria, this involves an extensive number of treatment options, and is a complaint that carries with it a high risk of complications and morbidity.  The differential diagnosis includes UTI, nephrolithiasis, neoplasm, prostatitis   Co morbidities that complicate the patient evaluation  prostate cancer, HTN, GI bleed, anemia, atrial fibrillation, HLD   Additional history obtained:  Additional history obtained from N/A External records from outside source obtained and reviewed including EMR   Lab Tests:  I Ordered, and personally interpreted labs.  The pertinent results include: Baseline anemia, no leukocytosis, normal kidney function, normal electrolytes   Imaging Studies ordered:  I ordered imaging studies including CT of abdomen and pelvis I independently visualized and interpreted imaging which showed (pending at time of signout) I agree with the radiologist interpretation   Cardiac Monitoring: / EKG:  The patient was maintained on a cardiac monitor.  I personally viewed and interpreted the cardiac monitored which showed an underlying rhythm of: Sinus rhythm  Problem List / ED Course / Critical interventions / Medication management  Patient presents for  painless gross hematuria, starting yesterday.  He does have a remote history of prostate cancer s/p prostatectomy and radiation.  He received treatment a year ago and has been told that his cancer is in remission.  He is followed by [facility] urology.  Patient endorses some mild lightheadedness recently.  He denies any other recent symptoms.  He is well-appearing on exam.  Vital signs are normal on arrival.  He was able to urinate and urine did show gross blood.  After urinating, bedside ultrasound showed normal decompressed bladder without any echogenic layering material.  Lab work does not show  any acute drop in hemoglobin.  Kidney function is normal.  Will obtain CT imaging to assess for sources of his acute bleeding.  Care of patient was signed out to oncoming ED provider. I ordered medication including IV fluids for hydration Reevaluation of the patient after these medicines showed that the patient improved I have reviewed the patients home medicines and have made adjustments as needed   Social Determinants of Health:  Has access to outpatient care         Final Clinical Impression(s) / ED Diagnoses Final diagnoses:  Gross hematuria    Rx / DC Orders ED Discharge Orders     None         Gloris Manchester, MD 05/29/23 [phone]

## 2023-05-29 NOTE — Discharge Instructions (Addendum)
As discussed, today's evaluation has been generally reassuring.  Your CT results are included below.  You should discuss these findings with your urologist and your physician who is planning for endoscopy.  Return here for concerning changes in your condition.  IMPRESSION: 1. No acute localizing findings in the abdomen or pelvis. 2. 1.8 cm indeterminate hypoattenuating pancreatic lesion at the junction of the body and tail. This could represent a pseudocyst, indolent neoplasm, such as intraductal papillary mucinous neoplasm, or cystic lesion. Recommend correlation with prior imaging if available. Consider further evaluation with pancreatic MRI. 3. Stable size of a 1.7 cm hypoattenuating pancreatic lesion the junction of the body and tail, favoring a benign etiology. 4. Colonic diverticulosis without evidence of acute diverticulitis.

## 2023-05-29 NOTE — ED Notes (Signed)
RN notified MD about urine being clear as it was bloody earlier.

## 2023-05-29 NOTE — ED Triage Notes (Addendum)
Pt arrives to ED c/o bright red blood and clots in urine since yesterday. Pt reports that he has multiple episodes of this. Pt reports that he is on blood thinners. Pt states that he feels light headed. Pt with hx of prostate cancer. Pt denies pain

## 2023-05-29 NOTE — ED Provider Notes (Signed)
Care of the patient assumed at signout. 10:09 AM Patient awake, alert, no ongoing complaints.  Urine has cleared, CT results reviewed, discussed, and he will have a CT imaging results available to take to his follow-up visits.  He is aware of abnormal pancreas, has scheduled follow-up for possible endoscopy.  He will also follow-up with urology.   Gerhard Munch, MD 05/29/23 1009

## 2023-05-29 NOTE — ED Notes (Signed)
Patient transported to CT 

## 2023-05-31 ENCOUNTER — Ambulatory Visit (HOSPITAL_COMMUNITY): Payer: Medicare HMO | Attending: Cardiology

## 2023-05-31 DIAGNOSIS — I480 Paroxysmal atrial fibrillation: Secondary | ICD-10-CM | POA: Insufficient documentation

## 2023-05-31 LAB — ECHOCARDIOGRAM COMPLETE
Area-P 1/2: 4.21 cm2
S' Lateral: 3.6 cm

## 2023-05-31 MED ORDER — PERFLUTREN LIPID MICROSPHERE
1.0000 mL | INTRAVENOUS | Status: AC | PRN
  Administered 2023-05-31: 3 mL via INTRAVENOUS

## 2023-06-03 NOTE — Progress Notes (Signed)
  Cardiology Office Note:  .   Date:  06/03/2023  ID:  Gregory Odonnell, DOB 10-07-1945, MRN [id number] PCP: Gregory Rakers, MD  [facility] Providers Cardiologist:  Gregory Mainland, MD PCP: Gregory Rakers, MD  Chief Complaint  Patient presents with   Paroxysmal atrial fibrillation    Follow-up    4-6 week       History of Present Illness: .    Gregory Odonnell is a 77 y.o. male with hypertension, paroxysmal atrial fibrillation  Patient is doing well. Gregory Odonnell denies chest pain, shortness of breath, leg edema, orthopnea, PND, TIA/syncope. Palpitations have improved after initiation of diltiazem 120 mg daily.  Blood pressure slightly low today, but is always high at doctor's office and at home.  Reviewed recent echocardiogram results with the patient, details below.    Vitals:   06/07/23 1528  BP: (!) 142/68  Pulse: (!) 58  Resp: 16  SpO2: 95%     ROS:  Review of Systems  Cardiovascular:  Negative for chest pain, dyspnea on exertion, leg swelling, palpitations and syncope.     Studies Reviewed: Marland Kitchen        Independently interpreted Echocardiogram 06/03/2023: Mild LVH, EF 65 to 70%, grade 2 diastolic dysfunction Moderate LA dilatation, mild RA dilatation RVSP 41 mmHg Ascending aorta 38 mm   Independently interpreted Labs 05/2023: Hb 11.7. No other significant abnormality Chol 162, TG 59, HDL 73, LDL 75 03/19/2023: ProBNP 606  Risk Assessment/Calculations:    CHA2DS2-VASc Score = 3   This indicates a  3.2% annual risk of stroke. The patient's score is based upon:        Physical Exam:   Physical Exam Vitals and nursing note reviewed.  Constitutional:      General: Gregory Odonnell is not in acute distress. Neck:     Vascular: No JVD.  Cardiovascular:     Rate and Rhythm: Normal rate and regular rhythm.     Heart sounds: Normal heart sounds. No murmur heard. Pulmonary:     Effort: Pulmonary effort is normal.     Breath sounds: Normal breath sounds. No wheezing  or rales.  Musculoskeletal:     Right lower leg: No edema.     Left lower leg: No edema.      VISIT DIAGNOSES:   ICD-10-CM   1. Paroxysmal atrial fibrillation (HCC)  I48.0     2. Essential hypertension  I10        ASSESSMENT AND PLAN: .    Gregory Odonnell is a 77 y.o. male with hypertension, paroxysmal atrial fibrillation.   Paroxysmal atrial fibrillation: Currently in sinus rhythm. CHA2DS2Vasc score 3, annual stroke risk 3.2%. Continue Eliquis 5 mg bid.   Hypertension: Generally well controlled at home. Renal function stable in spite of concurrent use of irbesartan hydrochlorothiazide, and hydrochlorothiazide. No change made today.  HFpEF: Grade 2 diastolic dysfunction on echocardiogram, elevated BNP at last visit. Leg edema is completely resolved with better blood pressure control. Monitor for now.   Mixed hyperlipidemia: Well controlled.  Continue Cresotr 10 mg.      No orders of the defined types were placed in this encounter.    F/u in 1 year  Signed, Gregory Negus, MD

## 2023-06-07 ENCOUNTER — Encounter: Payer: Self-pay | Admitting: Cardiology

## 2023-06-07 ENCOUNTER — Ambulatory Visit: Payer: Medicare HMO | Attending: Cardiology | Admitting: Cardiology

## 2023-06-07 VITALS — BP 142/68 | HR 58 | Resp 16 | Ht 73.0 in | Wt 237.0 lb

## 2023-06-07 DIAGNOSIS — I10 Essential (primary) hypertension: Secondary | ICD-10-CM | POA: Diagnosis not present

## 2023-06-07 DIAGNOSIS — I480 Paroxysmal atrial fibrillation: Secondary | ICD-10-CM | POA: Diagnosis not present

## 2023-06-07 NOTE — Patient Instructions (Signed)
Medication Instructions:  NO CHANGES *If you need a refill on your cardiac medications before your next appointment, please call your pharmacy*   Lab Work: NONE If you have labs (blood work) drawn today and your tests are completely normal, you will receive your results only by: MyChart Message (if you have MyChart) OR A paper copy in the mail If you have any lab test that is abnormal or we need to change your treatment, we will call you to review the results.   Testing/Procedures: NONE   Follow-Up: At [facility], you and your health needs are our priority.  As part of our continuing mission to provide you with exceptional heart care, we have created designated Provider Care Teams.  These Care Teams include your primary Cardiologist (physician) and Advanced Practice Providers (APPs -  Physician Assistants and Nurse Practitioners) who all work together to provide you with the care you need, when you need it.  We recommend signing up for the patient portal called "MyChart".  Sign up information is provided on this After Visit Summary.  MyChart is used to connect with patients for Virtual Visits (Telemedicine).  Patients are able to view lab/test results, encounter notes, upcoming appointments, etc.  Non-urgent messages can be sent to your provider as well.   To learn more about what you can do with MyChart, go to [email or web address].    Your next appointment:   1 year(s)  Provider:   DR Rosemary Holms      Other Instructions NONE

## 2023-07-04 ENCOUNTER — Telehealth: Payer: Self-pay | Admitting: Cardiology

## 2023-07-04 NOTE — Telephone Encounter (Signed)
Good Morning Dr. Enid Derry  We have received a surgical clearance request for Gregory Odonnell who is scheduled to undergo an endoscopy procedure with possible fine-needle aspiration. They were seen recently in clinic on 06/07/2023 for follow-up of paroxysmal AF and hypertension. Can you please comment on surgical clearance for her upcoming endoscopy procedure. Please forward you guidance and recommendations to P [facility] PREOP   Thank you Robin Searing, NP

## 2023-07-04 NOTE — Telephone Encounter (Signed)
Pharmacy please advise on holding Eliquis prior to endoscopy ultrasound with possible fine-needle aspiration scheduled for 07/15/2023. Thank you.

## 2023-07-04 NOTE — Telephone Encounter (Signed)
   Pre-operative Risk Assessment    Patient Name: Gregory Odonnell  DOB: 1945-12-24 MRN: [id number]      Request for Surgical Clearance    Procedure:   Endoscopy ultrasound with possible fine needle aspirations  Date of Surgery:  Clearance 07/15/23                                 Surgeon:  Dr. Pricilla Riffle Surgeon's Group or Practice Name: [facility]  Phone number:  [phone]  Fax number:  [phone]   Type of Clearance Requested:   - Medical  - Pharmacy:  Hold Apixaban (Eliquis)     Type of Anesthesia:  MAC   Additional requests/questions:   Caller Corrie Dandy) stated patient will need to hold his Eliquis two days prior to procedure.  Caller stated she will be on vacation tomorrow (12/20) and Monday (12/23) and needs a call back today.  Caller stated can also call alternate Shon Hale at [phone].  Signed, Annetta Maw   07/04/2023, 9:21 AM

## 2023-07-04 NOTE — Telephone Encounter (Signed)
Patient with diagnosis of afib on Eliquis for anticoagulation.    Procedure: Endoscopy ultrasound with possible fine needle aspirations  Date of procedure: 07/15/23   CHA2DS2-VASc Score = 3   This indicates a 3.2% annual risk of stroke. The patient's score is based upon: CHF History: 0 HTN History: 1 Diabetes History: 0 Stroke History: 0 Vascular Disease History: 0 Age Score: 2 Gender Score: 0      CrCl 68 ml/min  Per office protocol, patient can hold Eliquis for 2 days prior to procedure.    **This guidance is not considered finalized until pre-operative APP has relayed final recommendations.**

## 2023-07-11 ENCOUNTER — Telehealth: Payer: Self-pay | Admitting: Cardiology

## 2023-07-11 NOTE — Telephone Encounter (Signed)
Follow Up:      Patient is calling to check on the status of his clearance. 

## 2023-07-11 NOTE — Telephone Encounter (Signed)
Will forward to pre op APP to review.

## 2023-07-11 NOTE — Telephone Encounter (Signed)
Addressed in original clearance request.

## 2023-07-11 NOTE — Telephone Encounter (Signed)
   Patient Name: Gregory Odonnell  DOB: 24-Aug-1945 MRN: [id number]  Primary Cardiologist: Dr. Rosemary Holms  Chart reviewed as part of pre-operative protocol coverage. Patient was last seen by Dr. Rosemary Holms on 06/07/23, he was doing well at that time.  Called and spoke with patient today, he has remained stable from a cardiac standpoint.  Today he denies chest pain, shortness of breath, lower extremity edema, fatigue, palpitations, melena, hematuria, hemoptysis, diaphoresis, weakness, presyncope, syncope, orthopnea, and PND.  He is able to meet greater than 4 METs of activity. Given past medical history and time since last visit, based on ACC/AHA guidelines, Gregory Odonnell is at acceptable risk for the planned procedure without further cardiovascular testing.   Per office protocol patient can hold Eliquis for 2 days prior to procedure, please resume when safe to do so from a bleeding standpoint.  The patient was advised that if he develops new symptoms prior to surgery to contact our office to arrange for a follow-up visit, and he verbalized understanding.  I will route this recommendation to the requesting party via Epic fax function and remove from pre-op pool.  Please call with questions.  Rip Harbour, NP 07/11/2023, 12:59 PM

## 2023-07-15 DIAGNOSIS — Z79899 Other long term (current) drug therapy: Secondary | ICD-10-CM | POA: Diagnosis not present

## 2023-07-15 DIAGNOSIS — Z7901 Long term (current) use of anticoagulants: Secondary | ICD-10-CM | POA: Diagnosis not present

## 2023-07-15 DIAGNOSIS — I10 Essential (primary) hypertension: Secondary | ICD-10-CM | POA: Diagnosis not present

## 2023-07-15 DIAGNOSIS — K3189 Other diseases of stomach and duodenum: Secondary | ICD-10-CM | POA: Diagnosis not present

## 2023-07-15 DIAGNOSIS — K319 Disease of stomach and duodenum, unspecified: Secondary | ICD-10-CM | POA: Diagnosis not present

## 2023-07-15 DIAGNOSIS — K862 Cyst of pancreas: Secondary | ICD-10-CM | POA: Diagnosis not present

## 2023-08-15 DIAGNOSIS — I10 Essential (primary) hypertension: Secondary | ICD-10-CM | POA: Diagnosis not present

## 2023-08-15 DIAGNOSIS — M130 Polyarthritis, unspecified: Secondary | ICD-10-CM | POA: Diagnosis not present

## 2023-08-15 DIAGNOSIS — E1169 Type 2 diabetes mellitus with other specified complication: Secondary | ICD-10-CM | POA: Diagnosis not present

## 2023-08-15 DIAGNOSIS — E7800 Pure hypercholesterolemia, unspecified: Secondary | ICD-10-CM | POA: Diagnosis not present

## 2023-08-28 ENCOUNTER — Ambulatory Visit: Payer: Self-pay | Admitting: Cardiology

## 2023-09-10 DIAGNOSIS — N329 Bladder disorder, unspecified: Secondary | ICD-10-CM | POA: Diagnosis not present

## 2023-09-10 DIAGNOSIS — N3040 Irradiation cystitis without hematuria: Secondary | ICD-10-CM | POA: Diagnosis not present

## 2023-09-10 DIAGNOSIS — N3289 Other specified disorders of bladder: Secondary | ICD-10-CM | POA: Diagnosis not present

## 2023-09-10 DIAGNOSIS — Z08 Encounter for follow-up examination after completed treatment for malignant neoplasm: Secondary | ICD-10-CM | POA: Diagnosis not present

## 2023-10-23 DIAGNOSIS — C61 Malignant neoplasm of prostate: Secondary | ICD-10-CM | POA: Diagnosis not present

## 2023-10-23 DIAGNOSIS — Z923 Personal history of irradiation: Secondary | ICD-10-CM | POA: Diagnosis not present

## 2023-10-23 DIAGNOSIS — K862 Cyst of pancreas: Secondary | ICD-10-CM | POA: Diagnosis not present

## 2023-10-23 DIAGNOSIS — R399 Unspecified symptoms and signs involving the genitourinary system: Secondary | ICD-10-CM | POA: Diagnosis not present

## 2023-10-30 ENCOUNTER — Telehealth: Payer: Self-pay | Admitting: Cardiology

## 2023-10-30 DIAGNOSIS — I480 Paroxysmal atrial fibrillation: Secondary | ICD-10-CM

## 2023-10-30 MED ORDER — APIXABAN 5 MG PO TABS: 5.0000 mg | ORAL_TABLET | Freq: Two times a day (BID) | ORAL | 1 refills | Status: DC

## 2023-10-30 NOTE — Addendum Note (Signed)
 Addended by: Lia Rede on: 10/30/2023 03:37 PM   Modules accepted: Orders

## 2023-10-30 NOTE — Telephone Encounter (Signed)
 Prescription refill request for Eliquis received. Indication: PAF Last office visit: 06/07/23  Gregory Huxley MD Scr: 1.15 on 05/29/23  Epic Age: 78 Weight: 107.5kg  Based on above findings Eliquis 5mg  twice daily is the appropriate dose.  Refill approved.

## 2023-10-30 NOTE — Telephone Encounter (Signed)
 *  STAT* If patient is at the pharmacy, call can be transferred to refill team.   1. Which medications need to be refilled? (please list name of each medication and dose if known) apixaban (ELIQUIS) 5 MG TABS tablet    2. Would you like to learn more about the convenience, safety, & potential cost savings by using the [facility]? No      3. Are you open to using the [facility] (Type [facility]. ). No    4. Which pharmacy/location (including street and city if local pharmacy) is medication to be sent to?  [id number] - [place], [place] - [address]     5. Do they need a 30 day or 90 day supply? 90 days

## 2023-11-18 ENCOUNTER — Telehealth: Payer: Self-pay

## 2023-11-18 ENCOUNTER — Telehealth: Payer: Self-pay | Admitting: Cardiology

## 2023-11-18 NOTE — Telephone Encounter (Signed)
 Pt requesting patient assistance again for apixaban  (ELIQUIS ) 5 MG TABS tablet

## 2023-11-18 NOTE — Telephone Encounter (Signed)
 Thank you Gregory Odonnell! I am in office tomorrow and will mail the application paperwork and instructions to patient.

## 2023-11-18 NOTE — Telephone Encounter (Signed)
 PAP: Patient assistance application for Eliquis through [employer] ([employer]) has been mailed to pt's home address on file. Provider portion of application will be faxed to provider's office.

## 2023-11-18 NOTE — Telephone Encounter (Signed)
 Spoke with patient and he is requesting assistance for Eliquis . He feels he have spent the 3%.

## 2023-11-28 ENCOUNTER — Other Ambulatory Visit (HOSPITAL_COMMUNITY): Payer: Self-pay

## 2023-12-03 DIAGNOSIS — R609 Edema, unspecified: Secondary | ICD-10-CM | POA: Diagnosis not present

## 2023-12-03 DIAGNOSIS — M170 Bilateral primary osteoarthritis of knee: Secondary | ICD-10-CM | POA: Diagnosis not present

## 2023-12-03 DIAGNOSIS — R739 Hyperglycemia, unspecified: Secondary | ICD-10-CM | POA: Diagnosis not present

## 2023-12-03 DIAGNOSIS — I10 Essential (primary) hypertension: Secondary | ICD-10-CM | POA: Diagnosis not present

## 2023-12-03 DIAGNOSIS — Z6832 Body mass index (BMI) 32.0-32.9, adult: Secondary | ICD-10-CM | POA: Diagnosis not present

## 2023-12-03 DIAGNOSIS — E782 Mixed hyperlipidemia: Secondary | ICD-10-CM | POA: Diagnosis not present

## 2023-12-03 DIAGNOSIS — I5020 Unspecified systolic (congestive) heart failure: Secondary | ICD-10-CM | POA: Diagnosis not present

## 2023-12-03 DIAGNOSIS — I483 Typical atrial flutter: Secondary | ICD-10-CM | POA: Diagnosis not present

## 2023-12-06 ENCOUNTER — Telehealth: Payer: Self-pay | Admitting: Cardiology

## 2023-12-06 MED ORDER — HYDROCHLOROTHIAZIDE 12.5 MG PO CAPS: 12.5000 mg | ORAL_CAPSULE | Freq: Every day | ORAL | 1 refills | Status: DC

## 2023-12-06 NOTE — Telephone Encounter (Signed)
*  STAT* If patient is at the pharmacy, call can be transferred to refill team.   1. Which medications need to be refilled? (please list name of each medication and dose if known)   hydrochlorothiazide  (MICROZIDE ) 12.5 MG capsule   2. Which pharmacy/location (including street and city if local pharmacy) is medication to be sent to? [id number] - [place], [place] - [address] Phone: [phone]  Fax: [phone]     3. Do they need a 30 day or 90 day supply? 90  Pt is out of medication

## 2023-12-06 NOTE — Telephone Encounter (Signed)
 Pt's medication was sent to pt's pharmacy as requested. Confirmation received.

## 2023-12-16 ENCOUNTER — Encounter: Payer: Self-pay | Admitting: Cardiology

## 2023-12-16 ENCOUNTER — Ambulatory Visit: Attending: Cardiology | Admitting: Cardiology

## 2023-12-16 VITALS — BP 156/81 | HR 69 | Ht 73.0 in | Wt 241.0 lb

## 2023-12-16 DIAGNOSIS — I480 Paroxysmal atrial fibrillation: Secondary | ICD-10-CM

## 2023-12-16 DIAGNOSIS — I5030 Unspecified diastolic (congestive) heart failure: Secondary | ICD-10-CM | POA: Diagnosis not present

## 2023-12-16 DIAGNOSIS — I10 Essential (primary) hypertension: Secondary | ICD-10-CM

## 2023-12-16 MED ORDER — FUROSEMIDE 40 MG PO TABS: 40.0000 mg | ORAL_TABLET | Freq: Every day | ORAL | 3 refills | Status: DC

## 2023-12-16 MED ORDER — FUROSEMIDE 40 MG PO TABS: 40.0000 mg | ORAL_TABLET | Freq: Every day | ORAL | 3 refills | Status: AC

## 2023-12-16 NOTE — H&P (View-Only) (Signed)
 Cardiology Office Note:  .   Date:  12/16/2023  ID:  Gregory Odonnell, DOB 1945-08-10, MRN [id number] PCP: Jonathon Neighbors, MD  [facility] Providers Cardiologist:  Fransico Ivy, MD PCP: Jonathon Neighbors, MD  Chief Complaint  Patient presents with   Atrial Fibrillation      History of Present Illness: .    Gregory Odonnell is a 78 y.o. male with hypertension, paroxysmal atrial fibrillation  Patient has recently noticed leg swelling.  He does endorse increased salt intake recently.  He has noticed his blood pressure monitor notices irregular heart rate or atrial fibrillation.   Vitals:   12/16/23 1304  BP: (!) 156/81  Pulse: 69  SpO2: 95%      ROS:  Review of Systems  Cardiovascular:  Negative for chest pain, dyspnea on exertion, leg swelling, palpitations and syncope.     Studies Reviewed: Gregory Odonnell        EKG 12/16/2023: Atrial fibrillation Right bundle branch block T wave abnormality, consider lateral ischemia When compared with ECG of 14-Nov-2021 10:20, Atrial fibrillation has replaced Sinus rhythm T wave inversion no longer evident in Inferior leads    Echocardiogram 06/03/2023: Mild LVH, EF 65 to 70%, grade 2 diastolic dysfunction Moderate LA dilatation, mild RA dilatation RVSP 41 mmHg Ascending aorta 38 mm   Zio patch monitor 13 days 03/28/2023 - 04/11/2023: Dominant rhythm: Sinus. HR 39-94 bpm. Avg HR 55 bpm, in sinus rhythm w/bundle branch block/IVCD. 10 episodes of SVT/atrial tachycardia, fastest at 130 bpm for 15 beats, longest for 17 secs at 100 bpm. 2.8% isolated SVE, <1% couplet/triplets. 0 episodes of VT. <1% isolated VE, couplets. No atrial fibrillation/atrial flutter/VT/high grade AV block, sinus pause >3sec noted. 0 patient triggered events.    Labs  11/2023: Chol 142, TG 53, HDL 58, LDL 71 HbA1C 6.0% Hb 12 Cr 1.2 BNP 238  05/2023:  Hb 11.7 Chol 162, TG 59, HDL 73, LDL 75  03/19/2023:  ProBNP 829  Risk Assessment/Calculations:     CHA2DS2-VASc Score = 3  This indicates a 3.2% annual risk of stroke. The patient's score is based upon: CHF History: 0 HTN History: 1 Diabetes History: 0 Stroke History: 0 Vascular Disease History: 0 Age Score: 2 Gender Score: 0    Physical Exam:   Physical Exam Vitals and nursing note reviewed.  Constitutional:      General: He is not in acute distress. Neck:     Vascular: No JVD.  Cardiovascular:     Rate and Rhythm: Normal rate and regular rhythm.     Heart sounds: Normal heart sounds. No murmur heard. Pulmonary:     Effort: Pulmonary effort is normal.     Breath sounds: Normal breath sounds. No wheezing or rales.  Musculoskeletal:     Right lower leg: No edema.     Left lower leg: No edema.      VISIT DIAGNOSES:   ICD-10-CM   1. Paroxysmal atrial fibrillation (HCC)  I48.0 EKG 12-Lead    2. AF (paroxysmal atrial fibrillation) (HCC)  I48.0 EKG 12-Lead    3. Essential hypertension  I10 EKG 12-Lead    4. Heart failure with preserved ejection fraction, unspecified HF chronicity (HCC)  I50.30 EKG 12-Lead       ASSESSMENT AND PLAN: .    Gregory Odonnell is a 78 y.o. male with hypertension, paroxysmal atrial fibrillation.   Paroxysmal atrial fibrillation: In A-fib today with controlled ventricular rate. Monitor in 05/2023 did not show any A-fib.  This is in fact the first A-fib occurrence documented since his cardioversion in 11/2021. I wonder if some of his leg edema, elevated BNP could be related to A-fib. He has been compliant on Eliquis  5 mg twice daily. Recommend cardioversion to improve HFpEF symptoms. Recommend placing him on 30-day monitor at his follow-up visit after cardioversion to assess A-fib burden. Recent labs on 12/03/2023 documented above. Continue Nebivolol 20 mg daily.  He is taking diltiazem  and amlodipine .  Currently in A-fib with controlled ventricular rate, blood pressure is better.  I have not made change today.  In future, hopefully we  can wean him off one of the 2 diltiazem  or amlodipine .  Hypertension: Generally well controlled at home. Renal function stable in spite of concurrent use of irbesartan hydrochlorothiazide , and hydrochlorothiazide . No change made today.  HFpEF: Grade 2 diastolic dysfunction on echocardiogram, elevated BNP. A-fib also contributing. Increase Lasix  to 40 mg daily.    Mixed hyperlipidemia: Lipids very well-controlled on Crestor 10 mg daily.     Meds ordered this encounter  Medications   DISCONTD: furosemide  (LASIX ) 40 MG tablet    Sig: Take 1 tablet (40 mg total) by mouth daily.    Dispense:  90 tablet    Refill:  3   furosemide  (LASIX ) 40 MG tablet    Sig: Take 1 tablet (40 mg total) by mouth daily.    Dispense:  90 tablet    Refill:  3     Informed Consent   Shared Decision Making/Informed Consent The risks (stroke, cardiac arrhythmias rarely resulting in the need for a temporary or permanent pacemaker, skin irritation or burns and complications associated with conscious sedation including aspiration, arrhythmia, respiratory failure and death), benefits (restoration of normal sinus rhythm) and alternatives of a direct current cardioversion were explained in detail to Gregory Odonnell and he agrees to proceed.       F/u in 1 year  Signed, Cody Das, MD

## 2023-12-16 NOTE — Progress Notes (Signed)
 Cardiology Office Note:  .   Date:  12/16/2023  ID:  Gregory Odonnell, DOB 1945-08-10, MRN [id number] PCP: Jonathon Neighbors, MD  [facility] Providers Cardiologist:  Fransico Ivy, MD PCP: Jonathon Neighbors, MD  Chief Complaint  Patient presents with   Atrial Fibrillation      History of Present Illness: .    Gregory Odonnell is a 78 y.o. male with hypertension, paroxysmal atrial fibrillation  Patient has recently noticed leg swelling.  He does endorse increased salt intake recently.  He has noticed his blood pressure monitor notices irregular heart rate or atrial fibrillation.   Vitals:   12/16/23 1304  BP: (!) 156/81  Pulse: 69  SpO2: 95%      ROS:  Review of Systems  Cardiovascular:  Negative for chest pain, dyspnea on exertion, leg swelling, palpitations and syncope.     Studies Reviewed: Gregory Odonnell        EKG 12/16/2023: Atrial fibrillation Right bundle branch block T wave abnormality, consider lateral ischemia When compared with ECG of 14-Nov-2021 10:20, Atrial fibrillation has replaced Sinus rhythm T wave inversion no longer evident in Inferior leads    Echocardiogram 06/03/2023: Mild LVH, EF 65 to 70%, grade 2 diastolic dysfunction Moderate LA dilatation, mild RA dilatation RVSP 41 mmHg Ascending aorta 38 mm   Zio patch monitor 13 days 03/28/2023 - 04/11/2023: Dominant rhythm: Sinus. HR 39-94 bpm. Avg HR 55 bpm, in sinus rhythm w/bundle branch block/IVCD. 10 episodes of SVT/atrial tachycardia, fastest at 130 bpm for 15 beats, longest for 17 secs at 100 bpm. 2.8% isolated SVE, <1% couplet/triplets. 0 episodes of VT. <1% isolated VE, couplets. No atrial fibrillation/atrial flutter/VT/high grade AV block, sinus pause >3sec noted. 0 patient triggered events.    Labs  11/2023: Chol 142, TG 53, HDL 58, LDL 71 HbA1C 6.0% Hb 12 Cr 1.2 BNP 238  05/2023:  Hb 11.7 Chol 162, TG 59, HDL 73, LDL 75  03/19/2023:  ProBNP 829  Risk Assessment/Calculations:     CHA2DS2-VASc Score = 3  This indicates a 3.2% annual risk of stroke. The patient's score is based upon: CHF History: 0 HTN History: 1 Diabetes History: 0 Stroke History: 0 Vascular Disease History: 0 Age Score: 2 Gender Score: 0    Physical Exam:   Physical Exam Vitals and nursing note reviewed.  Constitutional:      General: He is not in acute distress. Neck:     Vascular: No JVD.  Cardiovascular:     Rate and Rhythm: Normal rate and regular rhythm.     Heart sounds: Normal heart sounds. No murmur heard. Pulmonary:     Effort: Pulmonary effort is normal.     Breath sounds: Normal breath sounds. No wheezing or rales.  Musculoskeletal:     Right lower leg: No edema.     Left lower leg: No edema.      VISIT DIAGNOSES:   ICD-10-CM   1. Paroxysmal atrial fibrillation (HCC)  I48.0 EKG 12-Lead    2. AF (paroxysmal atrial fibrillation) (HCC)  I48.0 EKG 12-Lead    3. Essential hypertension  I10 EKG 12-Lead    4. Heart failure with preserved ejection fraction, unspecified HF chronicity (HCC)  I50.30 EKG 12-Lead       ASSESSMENT AND PLAN: .    Gregory Odonnell is a 78 y.o. male with hypertension, paroxysmal atrial fibrillation.   Paroxysmal atrial fibrillation: In A-fib today with controlled ventricular rate. Monitor in 05/2023 did not show any A-fib.  This is in fact the first A-fib occurrence documented since his cardioversion in 11/2021. I wonder if some of his leg edema, elevated BNP could be related to A-fib. He has been compliant on Eliquis  5 mg twice daily. Recommend cardioversion to improve HFpEF symptoms. Recommend placing him on 30-day monitor at his follow-up visit after cardioversion to assess A-fib burden. Recent labs on 12/03/2023 documented above. Continue Nebivolol 20 mg daily.  He is taking diltiazem  and amlodipine .  Currently in A-fib with controlled ventricular rate, blood pressure is better.  I have not made change today.  In future, hopefully we  can wean him off one of the 2 diltiazem  or amlodipine .  Hypertension: Generally well controlled at home. Renal function stable in spite of concurrent use of irbesartan hydrochlorothiazide , and hydrochlorothiazide . No change made today.  HFpEF: Grade 2 diastolic dysfunction on echocardiogram, elevated BNP. A-fib also contributing. Increase Lasix  to 40 mg daily.    Mixed hyperlipidemia: Lipids very well-controlled on Crestor 10 mg daily.     Meds ordered this encounter  Medications   DISCONTD: furosemide  (LASIX ) 40 MG tablet    Sig: Take 1 tablet (40 mg total) by mouth daily.    Dispense:  90 tablet    Refill:  3   furosemide  (LASIX ) 40 MG tablet    Sig: Take 1 tablet (40 mg total) by mouth daily.    Dispense:  90 tablet    Refill:  3     Informed Consent   Shared Decision Making/Informed Consent The risks (stroke, cardiac arrhythmias rarely resulting in the need for a temporary or permanent pacemaker, skin irritation or burns and complications associated with conscious sedation including aspiration, arrhythmia, respiratory failure and death), benefits (restoration of normal sinus rhythm) and alternatives of a direct current cardioversion were explained in detail to Gregory Odonnell and he agrees to proceed.       F/u in 1 year  Signed, Cody Das, MD

## 2023-12-16 NOTE — Patient Instructions (Addendum)
 Medication Instructions:  INCREASE Lasix  to 40 mg daily   *If you need a refill on your cardiac medications before your next appointment, please call your pharmacy*   Testing/Procedures: DCCV      Dear Gregory Odonnell  You are scheduled for a Cardioversion on MONDAY, June 9 with Dr. Alda Amas.  Please arrive at the [facility] (Main Entrance A) at [facility]: [address] [place], [place] [address] at 9:00 AM (This time is 1.5 hour(s) before your procedure to ensure your preparation).   Free valet parking service is available. You will check in at ADMITTING.   *Please Note: You will receive a call the day before your procedure to confirm the appointment time. That time may have changed from the original time based on the schedule for that day.*    DIET:  Nothing to eat or drink after midnight except a sip of water with medications (see medication instructions below)  MEDICATION INSTRUCTIONS:        :1}HOLD: Dapagliflozin Gregory Odonnell) for 3 days prior to the procedure. Last dose on Monday, June 02.  Continue taking your anticoagulant (blood thinner): Apixaban  (Eliquis ).  You will need to continue this after your procedure until you are told by your provider that it is safe to stop.    LABS:  Bmet, mg  FYI:  For your safety, and to allow us  to monitor your vital signs accurately during the surgery/procedure we request: If you have artificial nails, gel coating, SNS etc, please have those removed prior to your surgery/procedure. Not having the nail coverings /polish removed may result in cancellation or delay of your surgery/procedure.  Your support person will be asked to wait in the waiting room during your procedure.  It is OK to have someone drop you off and come back when you are ready to be discharged.  You cannot drive after the procedure and will need someone to drive you home.  Bring your insurance cards.  *Special Note: Every effort is made to have your procedure  done on time. Occasionally there are emergencies that occur at the hospital that may cause delays. Please be patient if a delay does occur.       Follow-Up: At [facility], you and your health needs are our priority.  As part of our continuing mission to provide you with exceptional heart care, our providers are all part of one team.  This team includes your primary Cardiologist (physician) and Advanced Practice Providers or APPs (Physician Assistants and Nurse Practitioners) who all work together to provide you with the care you need, when you need it.  Your next appointment:   2 week(s)  Provider:   One of our Advanced Practice Providers (APPs): Gregory Springer, PA-C  Gregory Jetty, NP Gregory Hill, NP  Gregory Flake, PA-C Gregory Pray, NP  Gregory Harter, PA-C Gregory Rud, PA-C  [place], PA-C Gregory Dick, NP  Gregory Silvan, NP Gregory Sever, PA-C  Gregory Plant, PA-C    Gregory Dunn, PA-C  Gregory Weaver, PA-C Gregory Bobo, NP Gregory West, NP Gregory Goodrich, PA-C  Gregory Haillie Radu, PA-C Gregory Haley, PA-C  Gregory Zhao, NP Gregory Johnson, PA-C   Then, Gregory Das, MD will plan to see you again in 3 month(s).     Other Instructions none

## 2023-12-16 NOTE — Telephone Encounter (Signed)
 Error

## 2023-12-17 ENCOUNTER — Ambulatory Visit: Payer: Self-pay | Admitting: Cardiology

## 2023-12-17 LAB — BASIC METABOLIC PANEL WITH GFR
BUN/Creatinine Ratio: 18 (ref 10–24)
BUN: 20 mg/dL (ref 8–27)
CO2: 23 mmol/L (ref 20–29)
Calcium: 9.8 mg/dL (ref 8.6–10.2)
Chloride: 101 mmol/L (ref 96–106)
Creatinine, Ser: 1.13 mg/dL (ref 0.76–1.27)
Glucose: 99 mg/dL (ref 70–99)
Potassium: 4 mmol/L (ref 3.5–5.2)
Sodium: 140 mmol/L (ref 134–144)
eGFR: 67 mL/min/{1.73_m2} (ref >59)

## 2023-12-17 LAB — MAGNESIUM: Magnesium: 2.4 mg/dL — ABNORMAL HIGH (ref 1.6–2.3)

## 2023-12-20 NOTE — Progress Notes (Signed)
 Left detailed VM for  patient and instructed them to come at 0930  and to be NPO after 0000.  Medications reviewed and patient instructed.    Advised that patient to have a ride home and someone to stay with them for 24 hours after the procedure.   Left number to call back if they have additional instructions.

## 2023-12-23 ENCOUNTER — Encounter (HOSPITAL_COMMUNITY): Payer: Self-pay | Admitting: Cardiology

## 2023-12-23 ENCOUNTER — Ambulatory Visit (HOSPITAL_COMMUNITY): Admitting: Anesthesiology

## 2023-12-23 ENCOUNTER — Ambulatory Visit (HOSPITAL_COMMUNITY)
Admission: RE | Admit: 2023-12-23 | Discharge: 2023-12-23 | Disposition: A | Discharge status: Home / Self Care | Source: Ambulatory Visit | Attending: Cardiology | Admitting: Cardiology

## 2023-12-23 ENCOUNTER — Other Ambulatory Visit: Payer: Self-pay

## 2023-12-23 ENCOUNTER — Encounter (HOSPITAL_COMMUNITY)
Admission: RE | Disposition: A | Discharge status: Home / Self Care | Payer: Self-pay | Source: Ambulatory Visit | Attending: Cardiology

## 2023-12-23 DIAGNOSIS — I4819 Other persistent atrial fibrillation: Secondary | ICD-10-CM

## 2023-12-23 DIAGNOSIS — I480 Paroxysmal atrial fibrillation: Secondary | ICD-10-CM | POA: Diagnosis not present

## 2023-12-23 DIAGNOSIS — Z79899 Other long term (current) drug therapy: Secondary | ICD-10-CM | POA: Insufficient documentation

## 2023-12-23 DIAGNOSIS — E782 Mixed hyperlipidemia: Secondary | ICD-10-CM | POA: Diagnosis not present

## 2023-12-23 DIAGNOSIS — I110 Hypertensive heart disease with heart failure: Secondary | ICD-10-CM | POA: Insufficient documentation

## 2023-12-23 DIAGNOSIS — Z7901 Long term (current) use of anticoagulants: Secondary | ICD-10-CM | POA: Insufficient documentation

## 2023-12-23 DIAGNOSIS — I5030 Unspecified diastolic (congestive) heart failure: Secondary | ICD-10-CM | POA: Insufficient documentation

## 2023-12-23 DIAGNOSIS — G4733 Obstructive sleep apnea (adult) (pediatric): Secondary | ICD-10-CM | POA: Diagnosis not present

## 2023-12-23 DIAGNOSIS — J45909 Unspecified asthma, uncomplicated: Secondary | ICD-10-CM | POA: Diagnosis not present

## 2023-12-23 DIAGNOSIS — I10 Essential (primary) hypertension: Secondary | ICD-10-CM | POA: Diagnosis not present

## 2023-12-23 HISTORY — PX: CARDIOVERSION: EP1203

## 2023-12-23 LAB — POCT I-STAT, CHEM 8
BUN: 21 mg/dL (ref 8–23)
Calcium, Ion: 1.31 mmol/L (ref 1.15–1.40)
Chloride: 104 mmol/L (ref 98–111)
Creatinine, Ser: 1.2 mg/dL (ref 0.61–1.24)
Glucose, Bld: 106 mg/dL — ABNORMAL HIGH (ref 70–99)
HCT: 40 % (ref 39.0–52.0)
Hemoglobin: 13.6 g/dL (ref 13.0–17.0)
Potassium: 3.5 mmol/L (ref 3.5–5.1)
Sodium: 141 mmol/L (ref 135–145)
TCO2: 24 mmol/L (ref 22–32)

## 2023-12-23 SURGERY — CARDIOVERSION (CATH LAB)
Anesthesia: General

## 2023-12-23 MED ORDER — PROPOFOL 10 MG/ML IV BOLUS
INTRAVENOUS | Status: DC | PRN
  Administered 2023-12-23: 50 mg via INTRAVENOUS

## 2023-12-23 MED ORDER — SODIUM CHLORIDE 0.9% FLUSH: 3.0000 mL | INTRAVENOUS | Status: DC | PRN

## 2023-12-23 MED ORDER — SODIUM CHLORIDE 0.9% FLUSH: 3.0000 mL | Freq: Two times a day (BID) | INTRAVENOUS | Status: DC

## 2023-12-23 MED ORDER — LIDOCAINE 2% (20 MG/ML) 5 ML SYRINGE
INTRAMUSCULAR | Status: DC | PRN
  Administered 2023-12-23: 50 mg via INTRAVENOUS

## 2023-12-23 SURGICAL SUPPLY — 1 items: PAD DEFIB RADIO PHYSIO CONN (PAD) ×1 IMPLANT

## 2023-12-23 NOTE — Interval H&P Note (Signed)
 History and Physical Interval Note:  12/23/2023 10:55 AM  Gregory Odonnell  has presented today for surgery, with the diagnosis of PAF.  The various methods of treatment have been discussed with the patient and family. After consideration of risks, benefits and other options for treatment, the patient has consented to  Procedure(s): CARDIOVERSION (N/A) as a surgical intervention.  The patient's history has been reviewed, patient examined, no change in status, stable for surgery.  I have reviewed the patient's chart and labs.  Questions were answered to the patient's satisfaction.     Wendie Hamburg

## 2023-12-23 NOTE — CV Procedure (Signed)
 Procedure:   DCCV  Indication:  Symptomatic atrial fibrillation  Procedure Note:  The patient signed informed consent.  They have had had therapeutic anticoagulation with Eliquis  greater than 3 weeks.  Anesthesia was administered by Dr. Yvonnie Heritage and Adelene Homer, CRNA.  Adequate airway was maintained throughout and vital followed per protocol.  They were cardioverted x 1 with 200J of biphasic synchronized energy.  They converted to NSR with PACs, rate 50-60s.  There were no apparent complications.  The patient had normal neuro status and respiratory status post procedure with vitals stable as recorded elsewhere.    Follow up:  They will continue on current medical therapy and follow up with cardiology as scheduled.  Carson Clara, MD 12/23/2023 11:03 AM

## 2023-12-23 NOTE — Transfer of Care (Signed)
 Immediate Anesthesia Transfer of Care Note  Patient: Gregory Odonnell  Procedure(s) Performed: CARDIOVERSION  Patient Location: Cath Lab Holding  room 3  Anesthesia Type:MAC  Level of Consciousness: awake, alert , and patient cooperative  Airway & Oxygen Therapy: Patient Spontanous Breathing and Patient connected to nasal cannula oxygen  Post-op Assessment: Report given to RN and Post -op Vital signs reviewed and stable  Post vital signs: Reviewed and stable  Last Vitals:  Vitals Value Taken Time  BP    Temp    Pulse 58 12/23/23 1051  Resp 20 12/23/23 1051  SpO2 99 % 12/23/23 1051  Vitals shown include unfiled device data.  Last Pain:  Vitals:   12/23/23 1011  TempSrc:   PainSc: 0-No pain         Complications: There were no known notable events for this encounter.

## 2023-12-23 NOTE — Anesthesia Preprocedure Evaluation (Addendum)
 Anesthesia Evaluation  Patient identified by MRN, date of birth, ID band Patient awake    Reviewed: Allergy & Precautions, NPO status , Patient's Chart, lab work & pertinent test results  History of Anesthesia Complications Negative for: history of anesthetic complications  Airway Mallampati: III  TM Distance: >3 FB Neck ROM: Full    Dental no notable dental hx. (+) Dental Advisory Given   Pulmonary neg shortness of breath, asthma , sleep apnea and Continuous Positive Airway Pressure Ventilation , neg COPD, neg recent URI   Pulmonary exam normal breath sounds clear to auscultation       Cardiovascular hypertension, (-) angina (-) Past MI, (-) Cardiac Stents and (-) CABG Normal cardiovascular exam+ dysrhythmias Atrial Fibrillation + Valvular Problems/Murmurs (mild AI)  Rhythm:Irregular Rate:Normal  HLD  TTE 05/31/2023: IMPRESSIONS    1. Left ventricular ejection fraction, by estimation, is 65 to 70%. The  left ventricle has normal function. The left ventricle has no regional  wall motion abnormalities. There is mild concentric left ventricular  hypertrophy. Left ventricular diastolic  parameters are consistent with Grade II diastolic dysfunction  (pseudonormalization).   2. Right ventricular systolic function is normal. The right ventricular  size is normal. There is mildly elevated pulmonary artery systolic  pressure. The estimated right ventricular systolic pressure is 41.4 mmHg.   3. Left atrial size was moderately dilated.   4. Right atrial size was mildly dilated.   5. The mitral valve is normal in structure. Trivial mitral valve  regurgitation. No evidence of mitral stenosis.   6. The aortic valve is tricuspid. There is mild calcification of the  aortic valve. Aortic valve regurgitation is mild. Aortic valve  sclerosis/calcification is present, without any evidence of aortic  stenosis.   7. Aortic dilatation noted.  There is borderline dilatation of the  ascending aorta, measuring 38 mm.   8. The inferior vena cava is normal in size with greater than 50%  respiratory variability, suggesting right atrial pressure of 3 mmHg.     Neuro/Psych negative neurological ROS     GI/Hepatic Neg liver ROS,neg GERD  ,,Diverticulosis    Endo/Other  negative endocrine ROS    Renal/GU negative Renal ROS   Prostate cancer    Musculoskeletal  (+) Arthritis ,    Abdominal   Peds  Hematology  (+) Blood dyscrasia, anemia Lab Results      Component                Value               Date                      WBC                      5.4                 05/29/2023                HGB                      11.7 (L)            05/29/2023                HCT                      37.6 (L)  05/29/2023                MCV                      61.1 (L)            05/29/2023                PLT                      190                 05/29/2023              Anesthesia Other Findings   Reproductive/Obstetrics                             Anesthesia Physical Anesthesia Plan  ASA: 3  Anesthesia Plan: General   Post-op Pain Management: Minimal or no pain anticipated   Induction: Intravenous  PONV Risk Score and Plan: 2 and TIVA and Treatment may vary due to age or medical condition  Airway Management Planned: Natural Airway and Nasal Cannula  Additional Equipment: None  Intra-op Plan:   Post-operative Plan:   Informed Consent: I have reviewed the patients History and Physical, chart, labs and discussed the procedure including the risks, benefits and alternatives for the proposed anesthesia with the patient or authorized representative who has indicated his/her understanding and acceptance.       Plan Discussed with: CRNA and Anesthesiologist  Anesthesia Plan Comments: (Risks of general anesthesia discussed including, but not limited to, sore throat, hoarse voice,  chipped/damaged teeth, injury to vocal cords, nausea and vomiting, allergic reactions, lung infection, heart attack, stroke, and death. All questions answered. )        Anesthesia Quick Evaluation

## 2023-12-23 NOTE — Anesthesia Postprocedure Evaluation (Signed)
 Anesthesia Post Note  Patient: Gregory Odonnell  Procedure(s) Performed: CARDIOVERSION     Patient location during evaluation: PACU Anesthesia Type: General Level of consciousness: awake and alert and oriented Pain management: pain level controlled Vital Signs Assessment: post-procedure vital signs reviewed and stable Respiratory status: spontaneous breathing, nonlabored ventilation and respiratory function stable Cardiovascular status: blood pressure returned to baseline and stable Postop Assessment: no apparent nausea or vomiting Anesthetic complications: no   There were no known notable events for this encounter.  Last Vitals:  Vitals:   12/23/23 1104 12/23/23 1105  BP: 119/68 106/64  Pulse: 64 (!) 55  Resp: (!) 22 15  Temp:    SpO2: 99% 100%    Last Pain:  Vitals:   12/23/23 1104  TempSrc:   PainSc: 0-No pain                 Zamyra Allensworth A.

## 2023-12-25 NOTE — Telephone Encounter (Signed)
 Left patient a message following up on PAP app seeing if patient is still interested in assistance.

## 2023-12-27 DIAGNOSIS — L309 Dermatitis, unspecified: Secondary | ICD-10-CM | POA: Diagnosis not present

## 2024-01-07 ENCOUNTER — Encounter: Payer: Self-pay | Admitting: Physician Assistant

## 2024-01-07 ENCOUNTER — Ambulatory Visit: Attending: Physician Assistant | Admitting: Physician Assistant

## 2024-01-07 VITALS — BP 154/79 | HR 52 | Ht 73.0 in | Wt 237.8 lb

## 2024-01-07 DIAGNOSIS — I4819 Other persistent atrial fibrillation: Secondary | ICD-10-CM

## 2024-01-07 DIAGNOSIS — I10 Essential (primary) hypertension: Secondary | ICD-10-CM

## 2024-01-07 DIAGNOSIS — E782 Mixed hyperlipidemia: Secondary | ICD-10-CM | POA: Diagnosis not present

## 2024-01-07 DIAGNOSIS — I5032 Chronic diastolic (congestive) heart failure: Secondary | ICD-10-CM | POA: Diagnosis not present

## 2024-01-07 NOTE — Patient Instructions (Signed)
 Medication Instructions:  START TAKING 40 MG LASIX  DAILY *If you need a refill on your cardiac medications before your next appointment, please call your pharmacy*  Lab Work: BMP IN 2 WEEKS If you have labs (blood work) drawn today and your tests are completely normal, you will receive your results only by: MyChart Message (if you have MyChart) OR A paper copy in the mail If you have any lab test that is abnormal or we need to change your treatment, we will call you to review the results.  Testing/Procedures: [employer] Cardiac Event Monitor Instructions  Your physician has requested you wear your cardiac event monitor for 30 days, (1-30). [employer] may call or text to confirm a shipping address. The monitor will be sent to a land address via UPS. [employer] will not ship a monitor to a PO BOX. It typically takes 3-5 days to receive your monitor after it has been enrolled. [employer] will assist with USPS tracking if your package is delayed. The telephone number for [employer] is [phone]. Once you have received your monitor, please review the enclosed instructions. Instruction tutorials can also be viewed under help and settings on the enclosed cell phone. Your monitor has already been registered assigning a specific monitor serial # to you.  Billing and Self Pay Discount Information  [employer] has been provided the insurance information we had on file for you.  If your insurance has been updated, please call [employer] at [phone] to provide them with your updated insurance information.   [employer] offers a discounted Self Pay option for patients who have insurance that does not cover their cardiac event monitor or patients without insurance.  The discounted cost of a Self Pay Cardiac Event Monitor would be $225.00 , if the patient contacts [employer] at [phone] within 7 days of applying the monitor to make payment arrangements.  If the patient does not contact  [employer] within 7 days of applying the monitor, the cost of the cardiac event monitor will be $350.00.  Applying the monitor  Remove cell phone from case and turn it on. The cell phone works as IT consultant and needs to be within [employer] of you at all times. The cell phone will need to be charged on a daily basis. We recommend you plug the cell phone into the enclosed charger at your bedside table every night.  Monitor batteries: You will receive two monitor batteries labelled #1 and #2. These are your recorders. Plug battery #2 onto the second connection on the enclosed charger. Keep one battery on the charger at all times. This will keep the monitor battery deactivated. It will also keep it fully charged for when you need to switch your monitor batteries. A small light will be blinking on the battery emblem when it is charging. The light on the battery emblem will remain on when the battery is fully charged.  Open package of a Monitor strip. Insert battery #1 into black hood on strip and gently squeeze monitor battery onto connection as indicated in instruction booklet. Set aside while preparing skin.  Choose location for your strip, vertical or horizontal, as indicated in the instruction booklet. Shave to remove all hair from location. There cannot be any lotions, oils, powders, or colognes on skin where monitor is to be applied. Wipe skin clean with enclosed Saline wipe. Dry skin completely.  Peel paper labeled #1 off the back of the Monitor strip exposing the adhesive. Place the monitor on the chest in the vertical or horizontal  position shown in the instruction booklet. One arrow on the monitor strip must be pointing upward. Carefully remove paper labeled #2, attaching remainder of strip to your skin. Try not to create any folds or wrinkles in the strip as you apply it.  Firmly press and release the circle in the center of the monitor battery. You will hear a small beep. This  is turning the monitor battery on. The heart emblem on the monitor battery will light up every 5 seconds if the monitor battery in turned on and connected to the patient securely. Do not push and hold the circle down as this turns the monitor battery off. The cell phone will locate the monitor battery. A screen will appear on the cell phone checking the connection of your monitor strip. This may read poor connection initially but change to good connection within the next minute. Once your monitor accepts the connection you will hear a series of 3 beeps followed by a climbing crescendo of beeps. A screen will appear on the cell phone showing the two monitor strip placement options. Touch the picture that demonstrates where you applied the monitor strip.  Your monitor strip and battery are waterproof. You are able to shower, bathe, or swim with the monitor on. They just ask you do not submerge deeper than 3 feet underwater. We recommend removing the monitor if you are swimming in a lake, river, or ocean.  Your monitor battery will need to be switched to a fully charged monitor battery approximately once a week. The cell phone will alert you of an action which needs to be made.  On the cell phone, tap for details to reveal connection status, monitor battery status, and cell phone battery status. The green dots indicates your monitor is in good status. A red dot indicates there is something that needs your attention.  To record a symptom, click the circle on the monitor battery. In 30-60 seconds a list of symptoms will appear on the cell phone. Select your symptom and tap save. Your monitor will record a sustained or significant arrhythmia regardless of you clicking the button. Some patients do not feel the heart rhythm irregularities. [employer] will notify us  of any serious or critical events.  Refer to instruction booklet for instructions on switching batteries, changing strips, the Do not  disturb or Pause features, or any additional questions.  Call [employer] at [phone], to confirm your monitor is transmitting and record your baseline. They will answer any questions you may have regarding the monitor instructions at that time.  Returning the monitor to [employer]  Place all equipment back into blue box. Peel off strip of paper to expose adhesive and close box securely. There is a prepaid UPS shipping label on this box. Drop in a UPS drop box, or at a UPS facility like [employer]. You may also contact [employer] to arrange UPS to pick up monitor package at your home.   Follow-Up: At [facility], you and your health needs are our priority.  As part of our continuing mission to provide you with exceptional heart care, our providers are all part of one team.  This team includes your primary Cardiologist (physician) and Advanced Practice Providers or APPs (Physician Assistants and Nurse Practitioners) who all work together to provide you with the care you need, when you need it.  Your next appointment:   4 month(s)  Provider:   Newman JINNY Lawrence, MD

## 2024-01-07 NOTE — Progress Notes (Unsigned)
 Cardiology Office Note   Date:  01/08/2024  ID:  Gregory, Odonnell 08-Mar-1946, MRN [id number] PCP: Benjamine Aland, MD  [facility] Providers Cardiologist:  Newman JINNY Lawrence, MD     History of Present Illness Gregory Odonnell is a 78 y.o. male with past medical history of hypertension, HLD, HFpEF and paroxysmal atrial fibrillation.  He previously underwent cardioversion for A-fib in May 2023.  ZIO monitor in September 2024 showed minimal heart rate of 39, maximal heart rate 94, average heart bradycardia 55 bpm.  10 episode of SVT/atrial tachycardia, longest lasting 17 seconds.  No episode of VT.  Previous echocardiogram in November 2024 showed EF 65 to 70%, grade 2 DD, moderate LAE, RVSP 41 mmHg, mildly dilated ascending aorta measuring 38 mm.  He was last seen by Dr. Lawrence on 12/16/2023 at which time it was noted he was back in atrial fibrillation with controlled ventricular rate.  This is the first recurrence of A-fib since his previous cardioversion in 2023.  He has been compliant with Eliquis .  Outpatient cardioversion was recommended with 30-day heart monitor afterward to assess A-fib burden.  He underwent successful cardioversion by Dr. Kate on 12/23/2023.  Patient presents today for follow-up.  He is maintaining sinus rhythm.  He only had partial cardiac awareness of A-fib.  We will proceed with 30-day heart monitor to assess the A-fib burden.  Although his blood pressure is elevated today, however he says he is blood pressure is typically in the 120s at home and his blood pressure is always elevated in the doctor's office.  I will hold off on adjusting his blood pressure medication.  During the last visit, Dr. Lawrence increased his Lasix  to 40 mg daily which he never started.  He is still on 20 mg daily of Lasix .  On examination, he has trace lower extremity edema, he will start on the 40 mg daily of Lasix .  Patient will obtain basic metabolic panel in 2 weeks to make sure  his renal function and electrolyte is stable.  We may have to cut back Lasix  dose at that time.  He can follow-up with Dr. Lawrence in 4 months.  ROS:   He denies chest pain, palpitations, dyspnea, pnd, orthopnea, n, v, dizziness, syncope, weight gain, or early satiety. All other systems reviewed and are otherwise negative except as noted above.   He has trace lower extremity edema  Studies Reviewed EKG Interpretation Date/Time:  Tuesday January 07 2024 14:15:21 EDT Ventricular Rate:  52 PR Interval:  206 QRS Duration:  156 QT Interval:  458 QTC Calculation: 425 R Axis:   -43  Text Interpretation: Sinus bradycardia with Premature atrial complexes Left axis deviation Right bundle branch block T wave abnormality, consider lateral ischemia Lateral T wave inversion has been present on the previous EKG Confirmed by Sarahi Borland [phone]) on 01/07/2024 2:59:11 PM    Cardiac Studies & Procedures   ______________________________________________________________________________________________   STRESS TESTS  PCV MYOCARDIAL PERFUSION WITH LEXISCAN  04/11/2020  Narrative Lexiscan  (Walking with mod Bruce)Tetrofosmin Stress Test  04/11/2020: Nondiagnostic ECG stress. Resting EKG demonstrated normal sinus rhythm. Left QRS axis deviation in the presence of right bundle branch block. Peak EKG revealed no significant ST-T change from baseline abnormality. Myocardial perfusion is normal. Overall LV systolic function is normal without regional wall motion abnormalities. Stress LV EF: 76%. No previous exam available for comparison. Low risk.   ECHOCARDIOGRAM  ECHOCARDIOGRAM COMPLETE 05/31/2023  Narrative ECHOCARDIOGRAM REPORT    [name] Date of Exam: 05/31/2023 Medical Rec #:  [id number]         Height:       73.0 in Accession #:    [id number]        Weight:       233.0 lb Date of Birth:  06-29-1946        BSA:          2.296 m Patient Age:    76 years          BP:            147/81 mmHg Patient Gender: M                 HR:           58 bpm. Exam Location:  [address]  Procedure: 2D Echo, Cardiac Doppler, Color Doppler and Intracardiac Opacification Agent  Indications:    I48.0 Paroxsymal atrial fibrillation  History:        Patient has prior history of Echocardiogram examinations, most recent 04/06/2021. Arrythmias:Atrial Fibrillation; Risk Factors:Hypertension and Dyslipidemia. Previous echo LVEF 64% PAP 38 mmHg severe LAE mild TR/AI.  Sonographer:    Nolon Berg [facility] RDCS Referring Phys: [id number] [facility] PAT[facility]  IMPRESSIONS   1. Left ventricular ejection fraction, by estimation, is 65 to 70%. The left ventricle has normal function. The left ventricle has no regional wall motion abnormalities. There is mild concentric left ventricular hypertrophy. Left ventricular diastolic parameters are consistent with Grade II diastolic dysfunction (pseudonormalization). 2. Right ventricular systolic function is normal. The right ventricular size is normal. There is mildly elevated pulmonary artery systolic pressure. The estimated right ventricular systolic pressure is 41.4 mmHg. 3. Left atrial size was moderately dilated. 4. Right atrial size was mildly dilated. 5. The mitral valve is normal in structure. Trivial mitral valve regurgitation. No evidence of mitral stenosis. 6. The aortic valve is tricuspid. There is mild calcification of the aortic valve. Aortic valve regurgitation is mild. Aortic valve sclerosis/calcification is present, without any evidence of aortic stenosis. 7. Aortic dilatation noted. There is borderline dilatation of the ascending aorta, measuring 38 mm. 8. The inferior vena cava is normal in size with greater than 50% respiratory variability, suggesting right atrial pressure of 3 mmHg.  FINDINGS Left Ventricle: Left ventricular ejection fraction, by estimation, is 65 to 70%. The left ventricle has normal function. The left ventricle  has no regional wall motion abnormalities. Definity  contrast agent was given IV to delineate the left ventricular endocardial borders. The left ventricular internal cavity size was normal in size. There is mild concentric left ventricular hypertrophy. Left ventricular diastolic parameters are consistent with Grade II diastolic dysfunction (pseudonormalization).  Right Ventricle: The right ventricular size is normal. No increase in right ventricular wall thickness. Right ventricular systolic function is normal. There is mildly elevated pulmonary artery systolic pressure. The tricuspid regurgitant velocity is 3.10 m/s, and with an assumed right atrial pressure of 3 mmHg, the estimated right ventricular systolic pressure is 41.4 mmHg.  Left Atrium: Left atrial size was moderately dilated.  Right Atrium: Right atrial size was mildly dilated.  Pericardium: Trivial pericardial effusion is present.  Mitral Valve: The mitral valve is normal in structure. Mild mitral annular calcification. Trivial mitral valve regurgitation. No evidence of mitral valve stenosis.  Tricuspid Valve: The tricuspid valve is normal in structure. Tricuspid valve regurgitation is mild . No evidence of tricuspid stenosis.  Aortic Valve: The aortic valve is tricuspid. There is mild  calcification of the aortic valve. Aortic valve regurgitation is mild. Aortic valve sclerosis/calcification is present, without any evidence of aortic stenosis.  Pulmonic Valve: The pulmonic valve was normal in structure. Pulmonic valve regurgitation is trivial. No evidence of pulmonic stenosis.  Aorta: Aortic dilatation noted. There is borderline dilatation of the ascending aorta, measuring 38 mm.  Venous: The inferior vena cava is normal in size with greater than 50% respiratory variability, suggesting right atrial pressure of 3 mmHg.  IAS/Shunts: No atrial level shunt detected by color flow Doppler.   LEFT VENTRICLE PLAX 2D LVIDd:          5.50 cm   Diastology LVIDs:         3.60 cm   LV e' medial:    5.22 cm/s LV PW:         1.20 cm   LV E/e' medial:  18.8 LV IVS:        1.30 cm   LV e' lateral:   6.85 cm/s LVOT diam:     2.30 cm   LV E/e' lateral: 14.3 LV SV:         154 LV SV Index:   67 LVOT Area:     4.15 cm   RIGHT VENTRICLE             IVC RV Basal diam:  3.40 cm     IVC diam: 1.50 cm RV Mid diam:    3.30 cm RV S prime:     18.10 cm/s TAPSE (M-mode): 2.6 cm  LEFT ATRIUM             Index        RIGHT ATRIUM           Index LA diam:        5.30 cm 2.31 cm/m   RA Area:     19.80 cm LA Vol (A2C):   52.1 ml 22.69 ml/m  RA Volume:   56.30 ml  24.52 ml/m LA Vol (A4C):   54.9 ml 23.91 ml/m LA Biplane Vol: 53.2 ml 23.17 ml/m AORTIC VALVE LVOT Vmax:   158.00 cm/s LVOT Vmean:  107.000 cm/s LVOT VTI:    0.370 m  AORTA Ao Root diam: 3.00 cm Ao Asc diam:  3.60 cm  MITRAL VALVE               TRICUSPID VALVE MV Area (PHT): 4.21 cm    TR Peak grad:   38.4 mmHg MV Decel Time: 180 msec    TR Vmax:        310.00 cm/s MV E velocity: 98.00 cm/s MV A velocity: 58.20 cm/s  SHUNTS MV E/A ratio:  1.68        Systemic VTI:  0.37 m Systemic Diam: 2.30 cm  Toribio Fuel MD Electronically signed by Toribio Fuel MD Signature Date/Time: 05/31/2023/2:36:42 PM    Final    MONITORS  LONG TERM MONITOR (3-14 DAYS) 04/16/2023  Narrative Zio patch monitor 13 days 03/28/2023 - 04/11/2023: Dominant rhythm: Sinus. HR 39-94 bpm. Avg HR 55 bpm, in sinus rhythm w/bundle branch block/IVCD. 10 episodes of SVT/atrial tachycardia, fastest at 130 bpm for 15 beats, longest for 17 secs at 100 bpm. 2.8% isolated SVE, <1% couplet/triplets. 0 episodes of VT. <1% isolated VE, couplets. No atrial fibrillation/atrial flutter/VT/high grade AV block, sinus pause >3sec noted. 0 patient triggered events.       ______________________________________________________________________________________________      Risk  Assessment/Calculations  CHA2DS2-VASc Score = 3  This indicates a 3.2% annual risk of stroke. The patient's score is based upon: CHF History: 0 HTN History: 1 Diabetes History: 0 Stroke History: 0 Vascular Disease History: 0 Age Score: 2 Gender Score: 0           Physical Exam VS:  BP (!) 154/79 (BP Location: Left Arm, Cuff Size: Normal)   Pulse (!) 52   Ht 6' 1 (1.854 m)   Wt 237 lb 12.8 oz (107.9 kg)   SpO2 96%   BMI 31.37 kg/m        Wt Readings from Last 3 Encounters:  01/07/24 237 lb 12.8 oz (107.9 kg)  12/16/23 241 lb (109.3 kg)  06/07/23 237 lb (107.5 kg)    GEN: Well nourished, well developed in no acute distress NECK: No JVD; No carotid bruits CARDIAC: RRR, no murmurs, rubs, gallops RESPIRATORY:  Clear to auscultation without rales, wheezing or rhonchi  ABDOMEN: Soft, non-tender, non-distended EXTREMITIES:  No edema; No deformity   ASSESSMENT AND PLAN  Persistent atrial fibrillation: Holding sinus rhythm after recent cardioversion.  As recommended by Dr. Elmira, we will order 30-day heart monitor to assess A-fib burden  Chronic diastolic heart failure: Dr. Elmira increased his Lasix  to 40 mg daily during the last office visit, however he never took the higher dose of Lasix  and is still currently taking the 20 mg.  He does have trace lower extremity edema, I asked him to start taking the 40 mg daily dosing.  He will need basic metabolic panel in 1 to 2 weeks to reassess the renal function and electrolyte.  Hypertension: Blood pressure elevated in the office today, however his blood pressure is typically well-controlled at home.  Hyperlipidemia: On rosuvastatin        Dispo: Follow-up with Dr. Elmira in 4 months.  Signed, Scot Ford, PA

## 2024-01-20 NOTE — Telephone Encounter (Signed)
 Not yet eligible due to 3% out of pocket. Will reach back out once he has spending report.

## 2024-01-21 DIAGNOSIS — I5032 Chronic diastolic (congestive) heart failure: Secondary | ICD-10-CM | POA: Diagnosis not present

## 2024-01-22 LAB — BASIC METABOLIC PANEL WITH GFR
BUN/Creatinine Ratio: 19 (ref 10–24)
BUN: 22 mg/dL (ref 8–27)
CO2: 20 mmol/L (ref 20–29)
Calcium: 9.9 mg/dL (ref 8.6–10.2)
Chloride: 102 mmol/L (ref 96–106)
Creatinine, Ser: 1.14 mg/dL (ref 0.76–1.27)
Glucose: 91 mg/dL (ref 70–99)
Potassium: 4.4 mmol/L (ref 3.5–5.2)
Sodium: 138 mmol/L (ref 134–144)
eGFR: 66 mL/min/1.73 (ref >59)

## 2024-01-28 ENCOUNTER — Ambulatory Visit: Payer: Self-pay | Admitting: Physician Assistant

## 2024-02-06 DIAGNOSIS — E782 Mixed hyperlipidemia: Secondary | ICD-10-CM | POA: Diagnosis not present

## 2024-02-06 DIAGNOSIS — I110 Hypertensive heart disease with heart failure: Secondary | ICD-10-CM | POA: Diagnosis not present

## 2024-02-06 DIAGNOSIS — I509 Heart failure, unspecified: Secondary | ICD-10-CM | POA: Diagnosis not present

## 2024-02-06 DIAGNOSIS — D649 Anemia, unspecified: Secondary | ICD-10-CM | POA: Diagnosis not present

## 2024-02-06 DIAGNOSIS — R739 Hyperglycemia, unspecified: Secondary | ICD-10-CM | POA: Diagnosis not present

## 2024-02-06 DIAGNOSIS — Z6832 Body mass index (BMI) 32.0-32.9, adult: Secondary | ICD-10-CM | POA: Diagnosis not present

## 2024-02-06 DIAGNOSIS — I501 Left ventricular failure: Secondary | ICD-10-CM | POA: Diagnosis not present

## 2024-02-06 DIAGNOSIS — E7800 Pure hypercholesterolemia, unspecified: Secondary | ICD-10-CM | POA: Diagnosis not present

## 2024-02-06 DIAGNOSIS — R609 Edema, unspecified: Secondary | ICD-10-CM | POA: Diagnosis not present

## 2024-02-06 DIAGNOSIS — I10 Essential (primary) hypertension: Secondary | ICD-10-CM | POA: Diagnosis not present

## 2024-02-24 DIAGNOSIS — E119 Type 2 diabetes mellitus without complications: Secondary | ICD-10-CM | POA: Diagnosis not present

## 2024-02-24 DIAGNOSIS — I10 Essential (primary) hypertension: Secondary | ICD-10-CM | POA: Diagnosis not present

## 2024-02-27 ENCOUNTER — Other Ambulatory Visit: Payer: Self-pay | Admitting: Cardiology

## 2024-02-27 DIAGNOSIS — I480 Paroxysmal atrial fibrillation: Secondary | ICD-10-CM

## 2024-03-10 NOTE — Progress Notes (Unsigned)
 PATIENT: Gregory Odonnell DOB: 1946/06/30  REASON FOR VISIT: follow up HISTORY FROM: patient  No chief complaint on file.    HISTORY OF PRESENT ILLNESS:  03/10/24 ALL:  Gregory Odonnell returns for follow up for OSA on CPAP. He continues to do well on therapy.   03/11/2023 ALL:  Gregory Odonnell returns for follow up for OSA on CPAP. He continues to do well on therapy. He reports using CPAP every night but does not always leave it on all night. He feels he gets 4-5 hours of usage, on average. He denies difficulty using therapy or trouble with supplies. He usually wakes to use the restroom then discontinues therapy. He is using FFM. He denies concerns, today.   03/15/2022 ALL: Gregory Odonnell returns for OSA on CPAP. He continues to do well on CPAP. He is using CPAP nightly for about 6 hours. He denies concerns with his machine or supplies.     03/15/2021 ALL: Gregory Odonnell is a 78 y.o. male here today for follow up for OSA on CPAP.  HST 06/27/2020 showed moderate OSA with total AHI of 18.1/hr and O2 nadir of 89%. AutoPAP was ordered. He has used CPAP daily since being set up in 12/2020. He does feel that he is sleeping better. He feels more energized and better rested. He denies concerns with machine. He has noted a leak with his nasal mask. He is using a medium nasal mask and feels it is comfortable and fits well. He sleeps on his side and thinks leak if from mask getting shifted. He is monitoring at home and adjusting headgear.     HISTORY: (copied from Dr Obie previous note)  Dear Dr. Elmira,    I saw your patient, Gregory Odonnell, upon your kind request in my sleep clinic today for initial consultation of his sleep disorder, in particular, concern for underlying obstructive sleep apnea.  The patient is unaccompanied today.  As you know, Gregory. Odonnell is a 78 year old right-handed gentleman with an underlying medical history of paroxysmal A. fib, prostate cancer, hypertension,  diverticulosis, arthritis, seasonal asthma, and mild obesity, who reports snoring and some difficulty with sleep initiation and sleep maintenance.  He was diagnosed with paroxysmal A. fib not too long ago, it was in the context of preparing for cataract surgery.  He does not have any significant symptoms such as shortness of breath or palpitations or chest pains.  Years ago his wife had voiced concern that he may have pauses in his breathing.  She has not complained about it lately.  They have been married 44 years.  They have 1 grown daughter, age 18 and 1 grandson, age 47, 5 grandchildren.  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  He is a non-smoker and drinks no alcohol, limited caffeine in the form of coffee, 2 cups/day on average.  He typically goes to bed around 10 or 11.  He denies recurrent morning headaches but has nocturia about twice per average night, no obvious or known family history of sleep apnea.  His snoring can be loud and disturbing to his wife but will fluctuate. I reviewed your office note from 05/11/2020.  His Epworth sleepiness score is 2 out of 24, fatigue severity score is 11 out of 63.   REVIEW OF SYSTEMS: Out of a complete 14 system review of symptoms, the patient complains only of the following symptoms, none and all other reviewed systems are negative.   ALLERGIES: No Known Allergies  HOME MEDICATIONS:  Outpatient Medications Prior to Visit  Medication Sig Dispense Refill   amLODipine  (NORVASC ) 10 MG tablet Take 10 mg by mouth daily.     apixaban  (ELIQUIS ) 5 MG TABS tablet Take 1 tablet (5 mg total) by mouth 2 (two) times daily. 180 tablet 1   CARTIA  XT 120 MG 24 hr capsule TAKE ONE CAPSULE BY MOUTH ONCE A DAY 90 capsule 3   FARXIGA 10 MG TABS tablet Take 10 mg by mouth daily.     furosemide  (LASIX ) 40 MG tablet Take 1 tablet (40 mg total) by mouth daily. 90 tablet 3   hydrochlorothiazide  (MICROZIDE ) 12.5 MG capsule Take 1 capsule (12.5 mg total) by mouth  daily. 90 capsule 1   irbesartan-hydrochlorothiazide  (AVALIDE) 300-12.5 MG tablet Take 1 tablet by mouth daily.     montelukast (SINGULAIR) 10 MG tablet Take 10 mg by mouth daily as needed (allergies).     Nebivolol HCl (BYSTOLIC) 20 MG TABS Take 20 mg by mouth daily.     rosuvastatin (CRESTOR) 10 MG tablet Take 10 mg by mouth daily.     No facility-administered medications prior to visit.    PAST MEDICAL HISTORY: Past Medical History:  Diagnosis Date   Arthritis    maybe in my shoulders and one of my fingers on right hands (11/24/2015)   Diverticulosis    Hypertension    Pancreatic lesion    routinely [facility] by [facility]   Prostate cancer [facility])    prostate w/ ?mets to bladder   Seasonal asthma    sometimes in May    PAST SURGICAL HISTORY: Past Surgical History:  Procedure Laterality Date   BACK SURGERY     BLADDER SURGERY     CARDIOVERSION N/A 11/14/2021   Procedure: CARDIOVERSION;  Surgeon: Ladona Heinz, MD;  Location: [facility] ENDOSCOPY;  Service: Cardiovascular;  Laterality: N/A;   CARDIOVERSION N/A 12/23/2023   Procedure: CARDIOVERSION;  Surgeon: Kate Lonni CROME, MD;  Location: [facility] INVASIVE CV LAB;  Service: Cardiovascular;  Laterality: N/A;   LUMBAR DISC SURGERY  1988   herniated disc   PROSTATECTOMY  2011   TONSILLECTOMY  1950s    FAMILY HISTORY: Family History  Problem Relation Age of Onset   Cancer Father        prostate   Cancer Brother     SOCIAL HISTORY: Social History   Socioeconomic History   Marital status: Married    Spouse name: Not on file   Number of children: 2   Years of education: Not on file   Highest education level: Not on file  Occupational History   Occupation: retired  Tobacco Use   Smoking status: Never   Smokeless tobacco: Never  Vaping Use   Vaping status: Never Used  Substance and Sexual Activity   Alcohol use: Yes    Comment: 11/24/2015 I drink once q 6-7 months   Drug use: Not Currently    Types: Marijuana     Comment: back in the 1970s only   Sexual activity: Not Currently  Other Topics Concern   Not on file  Social History Narrative   Not on file   Social Drivers of Health   Financial Resource Strain: Not on file  Food Insecurity: No Food Insecurity (04/18/2022)   Hunger Vital Sign    Worried About Running Out of Food in the Last Year: Never true    Ran Out of Food in the Last Year: Never true  Transportation Needs: No Transportation Needs (04/18/2022)   PRAPARE -  Administrator, Civil Service (Medical): No    Lack of Transportation (Non-Medical): No  Physical Activity: Not on file  Stress: Not on file  Social Connections: Not on file  Intimate Partner Violence: Not on file     PHYSICAL EXAM  There were no vitals filed for this visit.    There is no height or weight on file to calculate BMI.  Generalized: Well developed, in no acute distress  Cardiology: normal rate and rhythm, no murmur noted Respiratory: clear to auscultation bilaterally  Neurological examination  Mentation: Alert oriented to time, place, history taking. Follows all commands speech and language fluent Cranial nerve II-XII: Pupils were equal round reactive to light. Extraocular movements were full, visual field were full  Motor: The motor testing reveals 5 over 5 strength of all 4 extremities. Good symmetric motor tone is noted throughout.  Gait and station: Gait is normal.    DIAGNOSTIC DATA (LABS, IMAGING, TESTING) - I reviewed patient records, labs, notes, testing and imaging myself where available.      No data to display           Lab Results  Component Value Date   WBC 5.4 05/29/2023   HGB 13.6 12/23/2023   HCT 40.0 12/23/2023   MCV 61.1 (L) 05/29/2023   PLT 190 05/29/2023      Component Value Date/Time   NA 138 01/21/2024 1227   K 4.4 01/21/2024 1227   CL 102 01/21/2024 1227   CO2 20 01/21/2024 1227   GLUCOSE 91 01/21/2024 1227   GLUCOSE 106 (H) 12/23/2023 1031    BUN 22 01/21/2024 1227   CREATININE 1.14 01/21/2024 1227   CALCIUM 9.9 01/21/2024 1227   PROT 6.9 05/29/2023 0608   ALBUMIN 3.6 05/29/2023 0608   AST 20 05/29/2023 0608   ALT 14 05/29/2023 0608   ALKPHOS 47 05/29/2023 0608   BILITOT 0.9 05/29/2023 0608   GFRNONAA >60 05/29/2023 0608   GFRAA 75 09/02/2020 0917   No results found for: CHOL, HDL, LDLCALC, LDLDIRECT, TRIG, CHOLHDL No results found for: YHAJ8R No results found for: VITAMINB12 No results found for: TSH   ASSESSMENT AND PLAN 78 y.o. year old male  has a past medical history of Arthritis, Diverticulosis, Hypertension, Pancreatic lesion, Prostate cancer (HCC), and Seasonal asthma. here with   No diagnosis found.    EKAM BESSON is doing well on CPAP therapy. Compliance report unavailable, today. He will return this week once Epic is restored to download report. He was encouraged to continue using CPAP nightly and for greater than 4 hours each night. He will continue to monitor for leak at home. Leak has improved. Risks of untreated sleep apnea review and education materials provided. Healthy lifestyle habits encouraged. He will follow up in 1 year, sooner if needed. He verbalizes understanding and agreement with this plan.    No orders of the defined types were placed in this encounter.     No orders of the defined types were placed in this encounter.     Greig Forbes, FNP-C 03/10/2024, 1:19 PM [facility] [address] [place], [place] [address] [phone]

## 2024-03-10 NOTE — Patient Instructions (Incomplete)
 Please continue using your CPAP regularly. While your insurance requires that you use CPAP at least 4 hours each night on 70% of the nights, I recommend, that you not skip any nights and use it throughout the night if you can. Getting used to CPAP and staying with the treatment long term does take time and patience and discipline. Untreated obstructive sleep apnea when it is moderate to severe can have an adverse impact on cardiovascular health and raise her risk for heart disease, arrhythmias, hypertension, congestive heart failure, stroke and diabetes. Untreated obstructive sleep apnea causes sleep disruption, nonrestorative sleep, and sleep deprivation. This can have an impact on your day to day functioning and cause daytime sleepiness and impairment of cognitive function, memory loss, mood disturbance, and problems focussing. Using CPAP regularly can improve these symptoms.  We will update supply orders, today. Listen for a call from [employer].   Follow up in 1 year

## 2024-03-11 ENCOUNTER — Ambulatory Visit: Payer: Medicare HMO | Admitting: Family Medicine

## 2024-03-11 ENCOUNTER — Telehealth: Payer: Self-pay

## 2024-03-11 ENCOUNTER — Encounter: Payer: Self-pay | Admitting: Family Medicine

## 2024-03-11 VITALS — BP 130/70 | HR 85 | Ht 73.0 in | Wt 237.0 lb

## 2024-03-11 DIAGNOSIS — G4733 Obstructive sleep apnea (adult) (pediatric): Secondary | ICD-10-CM

## 2024-03-11 NOTE — Telephone Encounter (Signed)
 LVM asking pt to bring his CPAP Machine to his appointment on today 03/11/2024.

## 2024-03-11 NOTE — Progress Notes (Signed)
 SABRA

## 2024-04-03 ENCOUNTER — Other Ambulatory Visit: Payer: Self-pay | Admitting: Cardiology

## 2024-04-03 DIAGNOSIS — I480 Paroxysmal atrial fibrillation: Secondary | ICD-10-CM

## 2024-04-03 NOTE — Telephone Encounter (Signed)
 Prescription refill request for Eliquis  received. Indication:afib Last office visit:6/25 Scr:1.14  7/25 Age: 78 Weight:107.5  kg  Prescription refilled

## 2024-04-30 ENCOUNTER — Ambulatory Visit: Attending: Cardiology | Admitting: Cardiology

## 2024-04-30 ENCOUNTER — Ambulatory Visit

## 2024-04-30 ENCOUNTER — Encounter: Payer: Self-pay | Admitting: Cardiology

## 2024-04-30 VITALS — BP 124/58 | HR 64 | Wt 241.6 lb

## 2024-04-30 DIAGNOSIS — I491 Atrial premature depolarization: Secondary | ICD-10-CM

## 2024-04-30 DIAGNOSIS — I480 Paroxysmal atrial fibrillation: Secondary | ICD-10-CM

## 2024-04-30 DIAGNOSIS — I10 Essential (primary) hypertension: Secondary | ICD-10-CM

## 2024-04-30 NOTE — Progress Notes (Unsigned)
 Enrolled for [employer] to mail a ZIO XT long term holter monitor to the patients address on file.

## 2024-04-30 NOTE — Patient Instructions (Signed)
 Medication Instructions:  Stop diltiazem  (Cartia  XT)  *If you need a refill on your cardiac medications before your next appointment, please call your pharmacy*  Lab Work: none If you have labs (blood work) drawn today and your tests are completely normal, you will receive your results only by: MyChart Message (if you have MyChart) OR A paper copy in the mail If you have any lab test that is abnormal or we need to change your treatment, we will call you to review the results.  Testing/Procedures: Your physician has requested that you have an echocardiogram. Echocardiography is a painless test that uses sound waves to create images of your heart. It provides your doctor with information about the size and shape of your heart and how well your heart's chambers and valves are working. This procedure takes approximately one hour. There are no restrictions for this procedure. Please do NOT wear cologne, perfume, aftershave, or lotions (deodorant is allowed). Please arrive 15 minutes prior to your appointment time.  Please note: We ask at that you not bring children with you during ultrasound (echo/ vascular) testing. Due to room size and safety concerns, children are not allowed in the ultrasound rooms during exams. Our front office staff cannot provide observation of children in our lobby area while testing is being conducted. An adult accompanying a patient to their appointment will only be allowed in the ultrasound room at the discretion of the ultrasound technician under special circumstances. We apologize for any inconvenience.  Zio XT Heart Monitor -  see instructions below  Follow-Up: At [facility], you and your health needs are our priority.  As part of our continuing mission to provide you with exceptional heart care, our providers are all part of one team.  This team includes your primary Cardiologist (physician) and Advanced Practice Providers or APPs (Physician Assistants and  Nurse Practitioners) who all work together to provide you with the care you need, when you need it.  Your next appointment:   3 month(s)  Provider:   Newman JINNY Lawrence, MD     Other Instructions [name] HEWS- Long Term Monitor Instructions  Your physician has requested you wear a ZIO patch monitor for 14 days.  This is a single patch monitor. [employer] supplies one patch monitor per enrollment. Additional stickers are not available. Please do not apply patch if you will be having a Nuclear Stress Test,  Echocardiogram, Cardiac CT, MRI, or Chest Xray during the period you would be wearing the  monitor. The patch cannot be worn during these tests. You cannot remove and re-apply the  ZIO XT patch monitor.  Your ZIO patch monitor will be mailed 3 day USPS to your address on file. It may take 3-5 days  to receive your monitor after you have been enrolled.  Once you have received your monitor, please review the enclosed instructions. Your monitor  has already been registered assigning a specific monitor serial # to you.  Billing and Patient Assistance Program Information  We have supplied [employer] with any of your insurance information on file for billing purposes. [employer] offers a sliding scale Patient Assistance Program for patients that do not have  insurance, or whose insurance does not completely cover the cost of the ZIO monitor.  You must apply for the Patient Assistance Program to qualify for this discounted rate.  To apply, please call [employer] at [phone], select option 4, select option 2, ask to apply for  Patient Assistance Program. Meredeth will ask your  household income, and how many people  are in your household. They will quote your out-of-pocket cost based on that information.  [employer] will also be able to set up a 53-month, interest-free payment plan if needed.  Applying the monitor   Shave hair from upper left chest.  Hold abrader disc by orange tab. Rub abrader in 40  strokes over the upper left chest as  indicated in your monitor instructions.  Clean area with 4 enclosed alcohol pads. Let dry.  Apply patch as indicated in monitor instructions. Patch will be placed under collarbone on left  side of chest with arrow pointing upward.  Rub patch adhesive wings for 2 minutes. Remove white label marked 1. Remove the white  label marked 2. Rub patch adhesive wings for 2 additional minutes.  While looking in a mirror, press and release button in center of patch. A small green light will  flash 3-4 times. This will be your only indicator that the monitor has been turned on.  Do not shower for the first 24 hours. You may shower after the first 24 hours.  Press the button if you feel a symptom. You will hear a small click. Record Date, Time and  Symptom in the Patient Logbook.  When you are ready to remove the patch, follow instructions on the last 2 pages of Patient  Logbook. Stick patch monitor onto the last page of Patient Logbook.  Place Patient Logbook in the blue and white box. Use locking tab on box and tape box closed  securely. The blue and white box has prepaid postage on it. Please place it in the mailbox as  soon as possible. Your physician should have your test results approximately 7 days after the  monitor has been mailed back to [facility].  Call [facility] Customer Care at [phone] if you have questions regarding  your ZIO XT patch monitor. Call them immediately if you see an orange light blinking on your  monitor.  If your monitor falls off in less than 4 days, contact our Monitor department at [phone].  If your monitor becomes loose or falls off after 4 days call [employer] at [phone] for  suggestions on securing your monitor

## 2024-04-30 NOTE — Progress Notes (Signed)
 Cardiology Office Note:  .   Date:  04/30/2024  ID:  Gregory Odonnell, DOB 10-17-1945, MRN [id number] PCP: Benjamine Aland, MD  [facility] Providers Cardiologist:  Newman Lawrence, MD PCP: Benjamine Aland, MD  Chief Complaint  Patient presents with   Atrial Fibrillation      History of Present Illness: .    Gregory Odonnell is a 78 y.o. male with hypertension, paroxysmal atrial fibrillation  Patient underwent successful cardioversion in 12/2023. Patient's leg edema has improved since then. He has only occasional palpitations symptoms.    Vitals:   04/30/24 1329  BP: (!) 124/58  Pulse: 64       ROS:  Review of Systems  Cardiovascular:  Positive for palpitations. Negative for chest pain, dyspnea on exertion, leg swelling and syncope.     Studies Reviewed: SABRA        EKG 04/30/2024: \ Sinus rhythm with Premature atrial complexes Right bundle branch block Left ventricular hypertrophy with repolarization abnormality ( R in aVL ) When compared with ECG of 07-Jan-2024 14:15, No significant change was found       Echocardiogram 06/03/2023: Mild LVH, EF 65 to 70%, grade 2 diastolic dysfunction Moderate LA dilatation, mild RA dilatation RVSP 41 mmHg Ascending aorta 38 mm   Zio patch monitor 13 days 03/28/2023 - 04/11/2023: Dominant rhythm: Sinus. HR 39-94 bpm. Avg HR 55 bpm, in sinus rhythm w/bundle branch block/IVCD. 10 episodes of SVT/atrial tachycardia, fastest at 130 bpm for 15 beats, longest for 17 secs at 100 bpm. 2.8% isolated SVE, <1% couplet/triplets. 0 episodes of VT. <1% isolated VE, couplets. No atrial fibrillation/atrial flutter/VT/high grade AV block, sinus pause >3sec noted. 0 patient triggered events.    Labs  11/2023: Chol 142, TG 53, HDL 58, LDL 71 HbA1C 6.0% Hb 12 Cr 1.2 BNP 238  05/2023:  Hb 11.7 Chol 162, TG 59, HDL 73, LDL 75  03/19/2023:  ProBNP 393  Risk Assessment/Calculations:    CHA2DS2-VASc Score = 3  This indicates  a 3.2% annual risk of stroke. The patient's score is based upon: CHF History: 0 HTN History: 1 Diabetes History: 0 Stroke History: 0 Vascular Disease History: 0 Age Score: 2 Gender Score: 0    Physical Exam:   Physical Exam Vitals and nursing note reviewed.  Constitutional:      General: He is not in acute distress. Neck:     Vascular: No JVD.  Cardiovascular:     Rate and Rhythm: Normal rate and regular rhythm. Frequent Extrasystoles are present.    Heart sounds: Murmur heard.     High-pitched blowing holosystolic murmur is present with a grade of 2/6 at the apex.  Pulmonary:     Effort: Pulmonary effort is normal.     Breath sounds: Normal breath sounds. No wheezing or rales.  Musculoskeletal:     Right lower leg: No edema.     Left lower leg: No edema.      VISIT DIAGNOSES:   ICD-10-CM   1. Paroxysmal atrial fibrillation (HCC)  I48.0 EKG 12-Lead    LONG TERM MONITOR (3-14 DAYS)    ECHOCARDIOGRAM COMPLETE    2. Premature atrial contractions  I49.1 LONG TERM MONITOR (3-14 DAYS)    ECHOCARDIOGRAM COMPLETE    3. Primary hypertension  I10         ASSESSMENT AND PLAN: .    Gregory Odonnell is a 78 y.o. male with hypertension, paroxysmal atrial fibrillation.   Paroxysmal atrial fibrillation: Cardioversion in 12/2023.  Sinus rhythm w/atrial bigeminy today. Recommend echocardiogram and 2 week Zio monitor. Continue Nebivolol 20 mg daily.  Will diltiazem  given that he is on amlodipine  as well. Continue Eliquis  5 mg bid (Age <60, weight >60 Kg, Cr <1.5)  Hypertension: Well controlled.  HFpEF: Euvolemic on Lasix  to 40 mg daily.    Mixed hyperlipidemia: Lipids very well-controlled on Crestor 10 mg daily.        F/u in 1 year  Signed, Newman JINNY Lawrence, MD

## 2024-05-06 DIAGNOSIS — Z923 Personal history of irradiation: Secondary | ICD-10-CM | POA: Diagnosis not present

## 2024-05-06 DIAGNOSIS — Z9221 Personal history of antineoplastic chemotherapy: Secondary | ICD-10-CM | POA: Diagnosis not present

## 2024-05-06 DIAGNOSIS — K7689 Other specified diseases of liver: Secondary | ICD-10-CM | POA: Diagnosis not present

## 2024-05-06 DIAGNOSIS — C61 Malignant neoplasm of prostate: Secondary | ICD-10-CM | POA: Diagnosis not present

## 2024-05-06 DIAGNOSIS — Z9079 Acquired absence of other genital organ(s): Secondary | ICD-10-CM | POA: Diagnosis not present

## 2024-05-18 ENCOUNTER — Other Ambulatory Visit: Payer: Self-pay | Admitting: Cardiology

## 2024-05-29 DIAGNOSIS — I480 Paroxysmal atrial fibrillation: Secondary | ICD-10-CM | POA: Diagnosis not present

## 2024-05-29 DIAGNOSIS — I491 Atrial premature depolarization: Secondary | ICD-10-CM | POA: Diagnosis not present

## 2024-06-02 ENCOUNTER — Ambulatory Visit: Payer: Self-pay | Admitting: Cardiology

## 2024-06-02 DIAGNOSIS — I480 Paroxysmal atrial fibrillation: Secondary | ICD-10-CM | POA: Diagnosis not present

## 2024-06-02 DIAGNOSIS — I491 Atrial premature depolarization: Secondary | ICD-10-CM | POA: Diagnosis not present

## 2024-06-02 NOTE — Progress Notes (Signed)
 No A-fib seen, but episodes of SVT noted.  Recommend vagal maneuvers send continue current medications.  If symptoms not improved, may consider EP referral.  Thanks MJP

## 2024-06-03 DIAGNOSIS — Z7984 Long term (current) use of oral hypoglycemic drugs: Secondary | ICD-10-CM | POA: Diagnosis not present

## 2024-06-03 DIAGNOSIS — I723 Aneurysm of iliac artery: Secondary | ICD-10-CM | POA: Diagnosis not present

## 2024-06-03 DIAGNOSIS — Z79899 Other long term (current) drug therapy: Secondary | ICD-10-CM | POA: Diagnosis not present

## 2024-06-03 DIAGNOSIS — K8689 Other specified diseases of pancreas: Secondary | ICD-10-CM | POA: Diagnosis not present

## 2024-06-03 DIAGNOSIS — K862 Cyst of pancreas: Secondary | ICD-10-CM | POA: Diagnosis not present

## 2024-06-03 DIAGNOSIS — Z7901 Long term (current) use of anticoagulants: Secondary | ICD-10-CM | POA: Diagnosis not present

## 2024-06-03 DIAGNOSIS — Z8551 Personal history of malignant neoplasm of bladder: Secondary | ICD-10-CM | POA: Diagnosis not present

## 2024-06-05 NOTE — Progress Notes (Signed)
 MyChart message containing providers result note and interpretation read by patient: Last read by [name] at 2:43PM on 06/04/2024.

## 2024-06-16 ENCOUNTER — Ambulatory Visit (HOSPITAL_COMMUNITY)
Admission: RE | Admit: 2024-06-16 | Discharge: 2024-06-16 | Disposition: A | Source: Ambulatory Visit | Attending: Cardiology

## 2024-06-16 DIAGNOSIS — I480 Paroxysmal atrial fibrillation: Secondary | ICD-10-CM | POA: Insufficient documentation

## 2024-06-16 DIAGNOSIS — I491 Atrial premature depolarization: Secondary | ICD-10-CM | POA: Insufficient documentation

## 2024-06-16 LAB — ECHOCARDIOGRAM COMPLETE
AR max vel: 2.79 cm2
AV Area VTI: 2.76 cm2
AV Area mean vel: 2.5 cm2
AV Mean grad: 6 mmHg
AV Peak grad: 11.2 mmHg
Ao pk vel: 1.67 m/s
Area-P 1/2: 3.65 cm2
S' Lateral: 2.18 cm

## 2024-06-16 MED ORDER — PERFLUTREN LIPID MICROSPHERE
1.0000 mL | INTRAVENOUS | Status: AC | PRN
  Administered 2024-06-16: 4 mL via INTRAVENOUS

## 2024-06-23 DIAGNOSIS — N329 Bladder disorder, unspecified: Secondary | ICD-10-CM | POA: Diagnosis not present

## 2024-06-25 ENCOUNTER — Telehealth: Payer: Self-pay

## 2024-06-25 NOTE — Progress Notes (Signed)
° °  06/25/2024  Patient ID: Gregory Odonnell, male   DOB: 24-Apr-1946, 78 y.o.   MRN: [id number]  Contacted patient regarding referral for medication access from Benjamine Aland, MD .   Spoke with patient today. I will follow up with [employer] and verify patient's re-enrollment status for Farxiga PAP.   Heather Factor, PharmD Clinical Pharmacist  [phone]

## 2024-07-23 ENCOUNTER — Encounter: Payer: Self-pay | Admitting: Cardiology

## 2024-07-23 ENCOUNTER — Ambulatory Visit: Attending: Cardiology | Admitting: Cardiology

## 2024-07-23 VITALS — BP 128/65 | HR 73 | Ht 73.0 in | Wt 245.0 lb

## 2024-07-23 DIAGNOSIS — I77810 Thoracic aortic ectasia: Secondary | ICD-10-CM | POA: Insufficient documentation

## 2024-07-23 DIAGNOSIS — I480 Paroxysmal atrial fibrillation: Secondary | ICD-10-CM | POA: Diagnosis not present

## 2024-07-23 DIAGNOSIS — I2729 Other secondary pulmonary hypertension: Secondary | ICD-10-CM | POA: Diagnosis not present

## 2024-07-23 DIAGNOSIS — I5032 Chronic diastolic (congestive) heart failure: Secondary | ICD-10-CM

## 2024-07-23 NOTE — Patient Instructions (Signed)
" ° °  Testing/Procedures: ECHOCARDIOGRAM IN 1 YEAR   Your physician has requested that you have an echocardiogram. Echocardiography is a painless test that uses sound waves to create images of your heart. It provides your doctor with information about the size and shape of your heart and how well your hearts chambers and valves are working. This procedure takes approximately one hour. There are no restrictions for this procedure. Please do NOT wear cologne, perfume, aftershave, or lotions (deodorant is allowed). Please arrive 15 minutes prior to your appointment time.  Please note: We ask at that you not bring children with you during ultrasound (echo/ vascular) testing. Due to room size and safety concerns, children are not allowed in the ultrasound rooms during exams. Our front office staff cannot provide observation of children in our lobby area while testing is being conducted. An adult accompanying a patient to their appointment will only be allowed in the ultrasound room at the discretion of the ultrasound technician under special circumstances. We apologize for any inconvenience.   Follow-Up: At [facility], you and your health needs are our priority.  As part of our continuing mission to provide you with exceptional heart care, our providers are all part of one team.  This team includes your primary Cardiologist (physician) and Advanced Practice Providers or APPs (Physician Assistants and Nurse Practitioners) who all work together to provide you with the care you need, when you need it.  Your next appointment:   1 year(s)  Provider:   Newman JINNY Lawrence, MD            "

## 2024-07-23 NOTE — Progress Notes (Signed)
 " Cardiology Office Note:  .   Date:  07/23/2024  ID:  Gregory Odonnell Server, DOB Oct 07, 1945, MRN [id number] PCP: Benjamine Aland, MD  [facility] Providers Cardiologist:  Newman Lawrence, MD PCP: Benjamine Aland, MD  Chief Complaint  Patient presents with   Paroxysmal A-fib      History of Present Illness: .    Gregory Odonnell is a 79 y.o. male with hypertension, paroxysmal atrial fibrillation  Patient underwent successful cardioversion in 12/2023. Patient's leg edema has improved since then. He has only occasional palpitations symptoms.  Reviewed recent echocardiogram results with the patient, reduce below.   Vitals:   07/23/24 1356  BP: 128/65  Pulse: 73  SpO2: 97%       ROS:  Review of Systems  Cardiovascular:  Positive for palpitations. Negative for chest pain, dyspnea on exertion, leg swelling and syncope.     Studies Reviewed: SABRA        EKG 04/30/2024: \ Sinus rhythm with Premature atrial complexes Right bundle branch block Left ventricular hypertrophy with repolarization abnormality ( R in aVL ) When compared with ECG of 07-Jan-2024 14:15, No significant change was found       Echocardiogram 06/2024:  1. Left ventricular ejection fraction, by estimation, is 65 to 70%. The  left ventricle has normal function. The left ventricle has no regional  wall motion abnormalities. There is moderate concentric left ventricular  hypertrophy. Left ventricular diastolic parameters are consistent with  Grade II diastolic dysfunction (pseudonormalization).   2. Right ventricular systolic function is normal. The right ventricular  size is not well visualized. There is moderately elevated pulmonary artery  systolic pressure. The estimated right ventricular systolic pressure is  50.2 mmHg.   3. Left atrial size was moderately dilated.   4. Right atrial size was mildly dilated.   5. The mitral valve is grossly normal. Trivial mitral valve  regurgitation. No evidence of  mitral stenosis.   6. The aortic valve is tricuspid. There is mild calcification of the  aortic valve. There is mild thickening of the aortic valve. Aortic valve  regurgitation is trivial. Aortic valve sclerosis/calcification is present,  without any evidence of aortic  stenosis.   7. Aortic dilatation noted. There is mild dilatation of the ascending  aorta, measuring 40 mm.   8. The inferior vena cava is dilated in size with >50% respiratory  variability, suggesting right atrial pressure of 8 mmHg.   Comparison(s): Prior images reviewed side by side. Ascending aorta now  measures 40 mm (prior 38 mm).   Conclusion(s)/Recommendation(s): Grade 2 diastolic dysfunction with  elevated RVSP.   Zio patch monitor 13 days 05/07/2024 - 05/20/2024: Dominant rhythm: Sinus w/BBB/IVCD. HR 41-97 bpm. Avg HR 59 bpm, in sinus rhythm. 30 episodes of SVT, fastest at 141 bpm for 10 beats, longest for 15.9 secs at 95 bpm. 2.1% isolated SVE, <1% couplet/triplets. 0 episodes of VT. <1% isolated VE, couplet/triplets. No atrial fibrillation/atrial flutter/VT/high grade AV block, sinus pause >3sec noted. 3 patient triggered events, correlated with SVE.     Labs  11/2023: Chol 142, TG 53, HDL 58, LDL 71 HbA1C 6.0% Hb 12 Cr 1.2 BNP 238  05/2023:  Hb 11.7 Chol 162, TG 59, HDL 73, LDL 75  03/19/2023:  ProBNP 393   Risk Assessment/Calculations:    CHA2DS2-VASc Score = 4  This indicates a 4.8% annual risk of stroke. The patient's score is based upon: CHF History: 1 HTN History: 1 Diabetes History: 0 Stroke History: 0  Vascular Disease History: 0 Age Score: 2 Gender Score: 0       Physical Exam:   Physical Exam Vitals and nursing note reviewed.  Constitutional:      General: He is not in acute distress. Neck:     Vascular: No JVD.  Cardiovascular:     Rate and Rhythm: Normal rate and regular rhythm. Frequent Extrasystoles are present.    Heart sounds: Murmur heard.     High-pitched  blowing holosystolic murmur is present with a grade of 2/6 at the apex.  Pulmonary:     Effort: Pulmonary effort is normal.     Breath sounds: Normal breath sounds. No wheezing or rales.  Musculoskeletal:     Right lower leg: No edema.     Left lower leg: No edema.      VISIT DIAGNOSES:   ICD-10-CM   1. Paroxysmal atrial fibrillation (HCC)  I48.0 ECHOCARDIOGRAM COMPLETE    2. Chronic heart failure with preserved ejection fraction (HFpEF) (HCC)  I50.32     3. Aortic root dilatation  I77.810     4. Other secondary pulmonary hypertension (HCC)  I27.29          ASSESSMENT AND PLAN: .    Gregory Odonnell is a 79 y.o. male with hypertension, paroxysmal atrial fibrillation.   Paroxysmal atrial fibrillation: Cardioversion in 12/2023. Occasional PSVT noted on 2-week Zio monitor. Continue Nebivolol 20 mg daily. Continue Eliquis  5 mg bid (Age <60, weight >60 Kg, Cr <1.5)  Hypertension: Well controlled.  HFpEF: Grade 2 diastolic dysfunction and mild pulmonary hypertension on echocardiogram, but symptomatically well-controlled.  Repeat echocardiogram in 1 year.   Mixed hyperlipidemia: Lipids very well-controlled on Crestor 10 mg daily.  Aortic root dilatation: 40 mm.  Repeat echocardiogram in 1 year.        F/u in 1 year  Signed, Newman JINNY Lawrence, MD  "

## 2025-03-15 ENCOUNTER — Ambulatory Visit: Admitting: Family Medicine
